# Patient Record
Sex: Female | Born: 1965
Health system: Southern US, Community
[De-identification: ages and names within clinical notes are randomized; demographics above are authoritative.]

## PROBLEM LIST (undated history)

## (undated) DIAGNOSIS — Z8659 Personal history of other mental and behavioral disorders: Secondary | ICD-10-CM

## (undated) DIAGNOSIS — C801 Malignant (primary) neoplasm, unspecified: Secondary | ICD-10-CM

## (undated) DIAGNOSIS — B009 Herpesviral infection, unspecified: Secondary | ICD-10-CM

## (undated) DIAGNOSIS — M1990 Unspecified osteoarthritis, unspecified site: Secondary | ICD-10-CM

## (undated) HISTORY — DX: Unspecified osteoarthritis, unspecified site: M19.90

## (undated) HISTORY — PX: HERNIA REPAIR: SHX51

## (undated) HISTORY — DX: Personal history of other mental and behavioral disorders: Z86.59

## (undated) HISTORY — DX: Malignant (primary) neoplasm, unspecified: C80.1

## (undated) HISTORY — PX: LIPOSUCTION: SHX10

## (undated) HISTORY — DX: Herpesviral infection, unspecified: B00.9

## (undated) HISTORY — PX: BASAL CELL CARCINOMA EXCISION: SHX1214

---

## 1998-10-14 ENCOUNTER — Encounter: Payer: Self-pay | Admitting: Endocrinology

## 1998-10-14 ENCOUNTER — Ambulatory Visit (HOSPITAL_COMMUNITY): Admission: RE | Admit: 1998-10-14 | Discharge: 1998-10-14 | Payer: Self-pay | Admitting: Endocrinology

## 1998-10-19 ENCOUNTER — Other Ambulatory Visit: Admission: RE | Admit: 1998-10-19 | Discharge: 1998-10-19 | Payer: Self-pay | Admitting: Obstetrics & Gynecology

## 1999-11-14 ENCOUNTER — Other Ambulatory Visit: Admission: RE | Admit: 1999-11-14 | Discharge: 1999-11-14 | Payer: Self-pay | Admitting: Obstetrics & Gynecology

## 2000-12-27 ENCOUNTER — Encounter (INDEPENDENT_AMBULATORY_CARE_PROVIDER_SITE_OTHER): Payer: Self-pay | Admitting: Specialist

## 2000-12-27 ENCOUNTER — Inpatient Hospital Stay (HOSPITAL_COMMUNITY): Admission: AD | Admit: 2000-12-27 | Discharge: 2000-12-31 | Payer: Self-pay | Admitting: Obstetrics & Gynecology

## 2001-02-03 ENCOUNTER — Other Ambulatory Visit: Admission: RE | Admit: 2001-02-03 | Discharge: 2001-02-03 | Payer: Self-pay | Admitting: Obstetrics and Gynecology

## 2002-02-23 ENCOUNTER — Other Ambulatory Visit: Admission: RE | Admit: 2002-02-23 | Discharge: 2002-02-23 | Payer: Self-pay | Admitting: Obstetrics and Gynecology

## 2002-12-09 ENCOUNTER — Encounter: Admission: RE | Admit: 2002-12-09 | Discharge: 2003-03-09 | Payer: Self-pay | Admitting: Obstetrics and Gynecology

## 2003-07-02 ENCOUNTER — Inpatient Hospital Stay (HOSPITAL_COMMUNITY): Admission: RE | Admit: 2003-07-02 | Discharge: 2003-07-05 | Payer: Self-pay | Admitting: Obstetrics and Gynecology

## 2003-08-13 ENCOUNTER — Other Ambulatory Visit: Admission: RE | Admit: 2003-08-13 | Discharge: 2003-08-13 | Payer: Self-pay | Admitting: Obstetrics and Gynecology

## 2005-06-29 ENCOUNTER — Other Ambulatory Visit: Admission: RE | Admit: 2005-06-29 | Discharge: 2005-06-29 | Payer: Self-pay | Admitting: Obstetrics and Gynecology

## 2006-05-01 ENCOUNTER — Encounter: Admission: RE | Admit: 2006-05-01 | Discharge: 2006-05-01 | Payer: Self-pay | Admitting: Obstetrics and Gynecology

## 2007-12-24 ENCOUNTER — Ambulatory Visit (HOSPITAL_BASED_OUTPATIENT_CLINIC_OR_DEPARTMENT_OTHER): Admission: RE | Admit: 2007-12-24 | Discharge: 2007-12-24 | Payer: Self-pay | Admitting: General Surgery

## 2009-02-24 ENCOUNTER — Encounter: Admission: RE | Admit: 2009-02-24 | Discharge: 2009-02-24 | Payer: Self-pay | Admitting: Family Medicine

## 2010-06-15 ENCOUNTER — Other Ambulatory Visit: Payer: Self-pay | Admitting: Dermatology

## 2010-07-20 ENCOUNTER — Other Ambulatory Visit: Payer: Self-pay | Admitting: Dermatology

## 2010-09-05 NOTE — Op Note (Signed)
Andrea Thomas, Andrea Thomas               ACCOUNT NO.:  [id number]   MEDICAL RECORD NO.:  [id number]          PATIENT TYPE:  AMB   LOCATION:  DSC                          FACILITY:  [facility]   PHYSICIAN:  Almond Lint, MD       DATE OF BIRTH:  [birth year]   DATE OF PROCEDURE:  12/24/2007  DATE OF DISCHARGE:                               OPERATIVE REPORT   PREOPERATIVE DIAGNOSIS:  Left infraclavicular mass.   POSTOPERATIVE DIAGNOSIS:  Left infraclavicular mass.   PROCEDURE PERFORMED:  Excision of left infraclavicular mass.   SURGEON:  Almond Lint, MD   ANESTHESIA:  MAC plus local.   FINDINGS:  Lobulated 5.3 cm mass which appears to be lipoma.   SPECIMEN:  Left infraclavicular mass to Pathology.   ESTIMATED BLOOD LOSS:  Minimal.   COMPLICATIONS:  None.   PROCEDURE:  Ms. Mervyn Gay was identified in the holding area, marked, and  taken to operating room where she was placed supine on the operating  room table.  Sedation was administered.  The left superior and lateral  chest was prepped and draped in a sterile fashion.  Time-out was  performed confirming the patient, site of surgery, operative staff,  preoperative antibiotic administration, and equipment.  This was correct  and we elected to proceed.  Local anesthesia was infiltrated over the  mass which was located very laterally on the clavicle.  A 3-cm incision  parallel to the clavicle was made overlying the mass after infiltrating  with local anesthesia, 1% lidocaine with epinephrine and 0.25% Marcaine  plain mixed was used.  The subcutaneous tissues were dissected to create  skin flaps with the Bovie electrocautery.  Once the mass was separated  from the subcutnaeous tissues in all sides, an Allis clamp was used to  elevate it from the wound bed.  The Bovie electrocautery was used to  come underneath the mass.  The mass was passed off the table and the  incision and surrounding areas were palpated carefully to ensure that no  residual mass remained.  It was then irrigated and hemostasis was  achieved with Bovie electrocautery.  The incision was then closed using  3-0 Vicryl in a interrupted deep dermal fashion and 4-0 Monocryl in a  running subcuticular fashion.  The wound was cleaned, dried, and dressed  with Dermabond and then the patient was awakened from anesthesia and  taken to PACU in stable condition.     Almond Lint, MD  Electronically Signed    FB/MEDQ  D:  12/24/2007  T:  12/25/2007  Job:  [id number]

## 2010-09-08 NOTE — Op Note (Signed)
[facility]  Patient:    Andrea Thomas, Andrea Thomas Visit Number: [id number] MRN: [id number]          Service Type: OBS Location: 910A 9139 01 Attending Physician:  Melony Overly Dictated by:   Devoria Albe Edward Jolly, M.D. Proc. Date: 12/28/00 Admit Date:  12/27/2000                             Operative Report  PREOPERATIVE DIAGNOSES:       1. Intrauterine pregnancy at 41+1 weeks.                               2. Failure to progress.                               3. Chorioamnionitis.  POSTOPERATIVE DIAGNOSES:      1. Intrauterine pregnancy at 41+1 weeks.                               2. Failure to progress.                               3. Chorioamnionitis.  PROCEDURE:                    Primary low segment transverse cesarean section.  SURGEON:                      Brook A. Edward Jolly, M.D.  ANESTHESIA:                   Epidural.  INTRAVENOUS FLUIDS:           1500 cc Ringers lactate.  ESTIMATED BLOOD LOSS:         1000 cc.  URINE OUTPUT:                 600 cc.  COMPLICATIONS:                None.  INDICATIONS:                  The patient was a 45 year old, gravida 1 para 0, admitted on December 27, 2000 at [redacted] weeks gestation, [facility] December 20, 2000, scheduled for induction on December 30, 2000 for post dates who was sent to the [facility] directly from the office for a spontaneous one minute deceleration noted during fetal movement on the routine nonstress test. Fetal heart rate was noted to rise spontaneously. Upon arrival to labor and delivery, the fetal heart rate tracing was noted to have a baseline of 120 with beat-to-beat variability and accelerations with reactivity. No decelerations of the fetal heart rate were noted. The patient had an occasional contraction. The cervix was examined there and noted to be closed, 50% effaced, and with the vertex at the -2 station.  The patient was admitted, and a recommendation was made to proceed with  a trial of vaginal delivery via Pitocin induction if the fetal heart rate remained reassuring. The patient and her husband chose to proceed as was scheduled. On December 28, 2000, the patients contractions became more painful and regular, and she received Stadol and then an epidural for anesthesia. An IUPC  was placed when the cervix achieved 4 cm of dilatation. The patient did have spontaneous rupture of membranes at 0038 with clear fluid. The patient went on to a cervical dilatation to the 6-7 cm. [place] units were adequate and the patient had no evidence of further cervical change beyond this point after two and one half hours. The patients temperature was also noted to rise to 100.3 degrees Fahrenheit. The fetal heart rate tracing continued to be reactive and reassuring. The patient was given a diagnosis of failure to progress with chorioamnionitis, and the recommendation was made to proceed with a primary low segment transverse cesarean section after the risks, benefits, and alternatives were discussed.  FINDINGS:                     A viable female infant was born at 2 with Apgars of 9 at one minute and 9 at five minutes. The weight was 9 pounds 2 ounces. The infant was noted to be vigorous at birth. No anomalies were appreciated. The placenta was also noted to have a normal appearance with insertion of a three-vessel cord. There was a 1.5-cm right paraovarian cyst. The left ovary and bilateral tubes were normal. There was an extension of the right inferior aspect of the lower uterine segment incision which extended down into the lower uterine segment and the vagina.  SPECIMENS:                    The placenta was sent to pathology.  DESCRIPTION OF PROCEDURE:     With an IV, Foley catheter, and epidural in place, the patient was taken from her labor and delivery suite to the operating room. The patient was placed in the supine position and her epidural was dosed for surgery.  The patients abdomen was then sterilely prepped and draped.  After adequate anesthesia was insured, a Pfannenstiel incision was created sharply with the scalpel. This was carried down to the fascia using the same. The fascia was then scored in the midline, and the incision was extended bilaterally in an upward fashion using the Mayo scissors. The rectus muscles were dissected off of the overlying fascia using sharp dissection with the Mayo scissors inferiorly and superiorly. The rectus muscles were then separated in the midline with a combination of blunt and sharp dissection with the Mayo scissors.  The peritoneal cavity was entered sharply with the Metzenbaum scissors. It was elevated with two hemostat clamps. The incision was extended cranially and caudally. A retractor then was placed over the lower uterine segment. The bladder flap was created sharply with the Metzenbaum scissors. The lower uterine segment was incised transversely with a scalpel. The incision was extended bilaterally with bandage scissors. A hand was inserted through the uterine incision, and the vertex was delivered. The nares and mouth were suctioned followed by delivery of the remainder of the infant. The cord was doubly clamped and cut, and the newborn was carried over the awaiting pediatricians. He was noted to be vigorous at birth.  Cord blood was obtained. The patient did receive Cefotetan 1 g IV. The placenta was manually extracted, and the patient received Pitocin 20 units intravenously. A moistened lap pad was used to remove any remaining membranes from within the uterine cavity. The extension of the lower uterine segment incision was then examined and its limits were defined. The extension was closed first with a running locked closure of #1 chromic. The transverse uterine incision was then also closed  with a running locked closure of #1 chromic. The transverse incision was closed with a second  imbricating layer of #1 chromic. An additional figure-of-eight suture was placed at the base, most inferior portion, of the lower uterine segment extension for good  hemostasis. There was extensive decidual reaction noted along the right anterior fundal region, and this was noted to be oozing. It responded to monopolar coagulation.  The uterus was returned to the peritoneal cavity, as it had been exteriorized for the procedure. The paracolic gutters were then irrigated with crystalloid solution and suctioned of the remaining fluid. The uterine incision was reexamined along with the extension, and hemostasis was noted to be excellent.  The edges of the fascia were examined, and there was no evidence of any hematoma. The fascia was therefore closed with a running suture of 0 Vicryl. The subcutaneous tissue was irrigated and suctioned with crystalloid solution. Hemostasis of small bleeding vessels was achieved with monopolar cautery. Interrupted sutures of 3-0 plain were placed in the subcutaneous layer followed by staples on the skin. A sterile bandage was then placed over this.  The uterus was expressed of any remaining clots. The patient was escorted to the recovery room in stable and awake condition. There were no complications to the procedure. All needle, instrument, and sponge counts were correct. ictated by:   Devoria Albe. Edward Jolly, M.D. Attending Physician:  Melony Overly DD:  12/28/00 TD:  12/30/00 Job: [id number]

## 2010-09-08 NOTE — Op Note (Signed)
NAMENELY, Andrea Thomas                         ACCOUNT NO.:  [id number]   MEDICAL RECORD NO.:  [id number]                   PATIENT TYPE:  INP   LOCATION:  NA                                   FACILITY:  [facility]   PHYSICIAN:  Carrington Clamp, M.D.              DATE OF BIRTH:  07-22-1965   DATE OF PROCEDURE:  07/02/2003  DATE OF DISCHARGE:                                 OPERATIVE REPORT   PREOPERATIVE DIAGNOSES:  1. Term pregnancy.  2. Prior cesarean section, desires repeat.   POSTOPERATIVE DIAGNOSES:  1. Term pregnancy.  2. Prior cesarean section, desires repeat.   PROCEDURE:  Repeat low transverse cesarean section at term.   ATTENDING:  Carrington Clamp, M.D.   ASSISTANT:  Luvenia Redden, M.D.   ANESTHESIA:  Spinal.   ESTIMATED BLOOD LOSS:  800 mL.   FLUIDS REPLACED:  3000 mL.   URINE OUTPUT:  150 mL.   COMPLICATIONS:  None.   FINDINGS:  A baby in the cephalic presentation; however, when the head was  attempted to be delivered, the head actually floated to a transverse  position.  Internal podalic version was successfully maneuvered to allow the  baby to be delivered breech in the usual fashion.  There were no  complications from this.  The baby was female, Apgars 9 and 9, weight 7  pounds 8 ounces.  There were normal tubes, ovaries, and uterus seen.   MEDICATIONS:  Clindamycin and Pitocin.   PATHOLOGY:  None.   COUNTS:  Correct x3.   TECHNIQUE:  After adequate spinal anesthesia was achieved, the patient was  prepped and draped in the usual sterile fashion in the dorsal supine  position with leftward tilt.  A Pfannenstiel skin incision was made with the  scalpel and carried down to the fascia with the Bovie cautery.  The fascia  was incised in the midline with the scalpel and then extended in a  transverse curvilinear manner with the Mayo scissors.  The fascia was  reflected superiorly and inferiorly from the rectus muscles and the rectus  muscles split in the  midline. The peritoneum was actually at this point  opened and the uterus could be seen.  Metzenbaums used to incise the  peritoneum in an inferior and superior manner with good visualization of the  bowel and bladder.   The bladder blade was placed and the vesicouterine fascia tented up and  incised in a transverse curvilinear manner.  The bladder flap was created  and the bladder blade replaced.  A 2 cm incision was made in the upper  portion of the lower uterine segment until the amnion was identified.  The  incision was extended in a transverse curvilinear manner and the amnion  ruptured with a pair of Allis clamps.  Clear fluid was noted.   The baby's head was grasped at this point with my right hand; however,  instead of coming  down toward the incision, the head actually floated up to  transverse position, where the baby was now back-up transverse.  At this  point it was decided to do an internal podalic version and the baby's left  leg was identified, palpated, and then brought through the incision.  The  right leg followed.  The hips were grasped and the baby was delivered from  the breech presentation without complication in the usual manner.  The baby  was bulb-suctioned and the cord was clamped and cut and the baby was handed  to awaiting pediatrics.   The cord bloods were obtained.  The placenta was delivered manually and the  uterus exteriorized, wrapped in a wet lap, cleared of all debris.  A locking  stitch of 0 Monocryl was performed, followed by an imbricating layer of 0  Monocryl.  Additional figure-of-eight stitches, approximately four of them,  were used to ensure hemostasis.   Once hemostasis was achieved, the uterus was reapproximated in the abdomen  and the gutters cleared of all debris with irrigation.  The uterine incision  was reinspected and found to be hemostatic.  The peritoneum was then closed  with a running stitch of 2-0 Vicryl.  The fascia was closed  with a running  stitch of 0 Vicryl.  The subcutaneous tissue was rendered hemostatic with  the Bovie cautery and irrigation.  This layer was then closed with  interrupted 2-0 plain gut stitches.  The skin was closed with staples.  The  patient tolerated the procedure well, was returned to the recovery room in  stable condition.                                               Carrington Clamp, M.D.    MH/MEDQ  D:  07/02/2003  T:  07/03/2003  Job:  [id number]

## 2010-09-08 NOTE — Discharge Summary (Signed)
[facility]  Patient:    Andrea Thomas, Andrea Thomas Visit Number: [id number] MRN: [id number]          Service Type: OBS Location: 910A 9139 01 Attending Physician:  Melony Overly Proc. Date: 12/27/00 Admit Date:  12/27/2000 Discharge Date: 12/31/2000                             Discharge Summary  ADMISSION DIAGNOSES:          Intrauterine pregnancy at 41 weeks.  DISCHARGE DIAGNOSES:          1. Intrauterine pregnancy at 41 weeks.                               2. Status post primary low transverse cesarean                                  section.                               3. Chorioamnionitis.  HOSPITAL COURSE:              The patient is a 45 year old, gravida 1, para 0, at 41 weeks who was admitted to labor and delivery for induction after spontaneous decelleration noted while having a routine NST in the office.  The patient was admitted.  Her induction was started, however, she failed to progress.  She did develop a temperature during labor and she was given antibiotics.  A primary low transverse cesarean section was performed and the baby was a viable female with Apgars of 9 and 9.  The patient did well postoperatively.  She defervesced fine.  She had an uncomplicated postoperative course.  Her postoperative day #1 hemoglobin was 8.3 and postoperative white blood cell count was 12.8.  She was discharged home on postoperative day #3 in good condition.  She was discharged home with instructions to call if she has any nausea, vomiting, severe abdominal pain, redness from incision site.  She was given prescriptions for Motrin and Tylox.   FOLLOW-UP:                    She will follow up in the office in four to six weeks.  DISCHARGE INSTRUCTIONS:       She was advised no driving for two to three weeks. Attending Physician:  Melony Overly DD:  01/22/01 TD:  01/22/01 Job: [id number]

## 2010-09-08 NOTE — Discharge Summary (Signed)
Andrea Thomas, Andrea Thomas                         ACCOUNT NO.:  [id number]   MEDICAL RECORD NO.:  [id number]                   PATIENT TYPE:  INP   LOCATION:  9122                                 FACILITY:  [facility]   PHYSICIAN:  Carrington Clamp, M.D.              DATE OF BIRTH:  11/12/1965   DATE OF ADMISSION:  07/02/2003  DATE OF DISCHARGE:  07/05/2003                                 DISCHARGE SUMMARY   DIAGNOSES:  1. Intrauterine pregnancy at term.  2. History of prior cesarean section and desires repeat cesarean section.  3. Declines vaginal birth after cesarean.   PROCEDURE:  Repeat low transverse cesarean section.  Surgeon:  Dr. Carrington Clamp.  Assistant:  Dr. Lodema Hong.  Complications:  None.   This 45 year old G2 P1-0-0-1 presents at [redacted] weeks gestation for repeat  cesarean section.  The patient's antepartum course had been complicated by  advanced maternal age.  She declined amniocentesis or any further testing.  The patient also had a history of depression but did not need any  medications during this current pregnancy.  Otherwise, the patient's  antepartum course had been uncomplicated.  She had a negative group B strep  culture performed in the office.  She was admitted at this time for repeat  cesarean section.  She was taken to the operating room on July 02, 2003 by  Dr. Carrington Clamp where repeat low transverse cesarean section was  performed with the delivery of a 7-pound 8-ounce female infant with Apgars  of 9 and 9.  Delivery went without complications.  The patient's  postoperative course was benign without significant fevers.  She was felt  ready for discharge on postoperative day #3.  She was sent home on a regular  diet, told to decrease activities, told to continue prenatal vitamins, was  given a prescription for Percocet one q.4-6h. as needed for pain, was to  follow up in the office in 2 weeks for an incision check; of course, was to  call with any  increased pain or fevers.   LABORATORY DATA ON DISCHARGE:  The patient had a hemoglobin of 8.6; white  blood cell count of 7.6; and platelets of 201,000.     [name], P.A.-C.                Carrington Clamp, M.D.    MB/MEDQ  D:  08/02/2003  T:  08/02/2003  Job:  [id number]

## 2011-08-29 ENCOUNTER — Other Ambulatory Visit: Payer: Self-pay | Admitting: Dermatology

## 2011-10-10 ENCOUNTER — Other Ambulatory Visit: Payer: Self-pay | Admitting: Obstetrics and Gynecology

## 2011-10-10 DIAGNOSIS — N63 Unspecified lump in unspecified breast: Secondary | ICD-10-CM

## 2011-10-16 ENCOUNTER — Ambulatory Visit
Admission: RE | Admit: 2011-10-16 | Discharge: 2011-10-16 | Disposition: A | Discharge status: Home / Self Care | Payer: BC Managed Care – PPO | Source: Ambulatory Visit | Attending: Obstetrics and Gynecology | Admitting: Obstetrics and Gynecology

## 2011-10-16 DIAGNOSIS — N63 Unspecified lump in unspecified breast: Secondary | ICD-10-CM

## 2011-10-26 ENCOUNTER — Other Ambulatory Visit: Payer: Self-pay | Admitting: Dermatology

## 2012-10-13 ENCOUNTER — Other Ambulatory Visit: Payer: Self-pay | Admitting: Obstetrics & Gynecology

## 2013-10-09 ENCOUNTER — Other Ambulatory Visit: Payer: Self-pay | Admitting: Dermatology

## 2014-10-29 ENCOUNTER — Other Ambulatory Visit: Payer: Self-pay | Admitting: Orthopaedic Surgery

## 2014-10-29 DIAGNOSIS — M25572 Pain in left ankle and joints of left foot: Secondary | ICD-10-CM

## 2014-11-11 ENCOUNTER — Ambulatory Visit
Admission: RE | Admit: 2014-11-11 | Discharge: 2014-11-11 | Disposition: A | Discharge status: Home / Self Care | Payer: Self-pay | Source: Ambulatory Visit | Attending: Orthopaedic Surgery | Admitting: Orthopaedic Surgery

## 2014-11-11 DIAGNOSIS — M25572 Pain in left ankle and joints of left foot: Secondary | ICD-10-CM

## 2014-11-17 ENCOUNTER — Ambulatory Visit (INDEPENDENT_AMBULATORY_CARE_PROVIDER_SITE_OTHER): Payer: BLUE CROSS/BLUE SHIELD | Admitting: Physician Assistant

## 2014-11-17 ENCOUNTER — Ambulatory Visit
Admission: RE | Admit: 2014-11-17 | Discharge: 2014-11-17 | Disposition: A | Discharge status: Home / Self Care | Payer: BLUE CROSS/BLUE SHIELD | Source: Ambulatory Visit | Attending: Physician Assistant | Admitting: Physician Assistant

## 2014-11-17 VITALS — BP 140/80 | HR 84 | Temp 98.3°F | Resp 16 | Ht 63.0 in | Wt 178.0 lb

## 2014-11-17 DIAGNOSIS — R1031 Right lower quadrant pain: Secondary | ICD-10-CM

## 2014-11-17 NOTE — Patient Instructions (Signed)
Go to [facility] [address] At 3:10 pm for 3:30 appt. Ph# [phone]

## 2014-11-18 ENCOUNTER — Other Ambulatory Visit: Payer: Self-pay | Admitting: Physician Assistant

## 2014-11-18 DIAGNOSIS — M25551 Pain in right hip: Secondary | ICD-10-CM

## 2014-11-18 NOTE — Progress Notes (Signed)
   11/18/2014 at 6:24 AM  Raymond / DOB: [birth year] / MRN: [id number]  The patient  does not have a problem list on file.  SUBJECTIVE  Chief complaint: Pain in rt inguinal area  Patient here today for right inguinal pain that worsened after receiving a steroid shot roughly 7 days ago for same. She associates some mild swelling and tenderness of the area.  She has never had this problem before.    She  has a past medical history of Arthritis and Cancer (Basil Cell).    Medications reviewed and updated by myself where necessary, and exist elsewhere in the encounter.   Ms. Merwyn Katos is allergic to penicillins. She  reports that she has never smoked. She does not have any smokeless tobacco history on file. She reports that she drinks about 0.6 oz of alcohol per week. She reports that she does not use illicit drugs. She  has no sexual activity history on file. The patient  has past surgical history that includes Cesarean section and Hernia repair.  Her family history includes Cancer in her father; Heart disease in her father.  Review of Systems  Constitutional: Negative for fever.  Gastrointestinal: Negative for nausea.  Musculoskeletal: Positive for myalgias. Negative for joint pain.  Skin: Negative for rash.  Neurological: Negative for dizziness.    OBJECTIVE  Her  height is 5\' 3"  (1.6 m) and weight is 178 lb (80.74 kg). Her oral temperature is 98.3 F (36.8 C). Her blood pressure is 140/80 and her pulse is 84. Her respiration is 16 and oxygen saturation is 99%.  The patient's body mass index is 31.54 kg/(m^2).  Physical Exam  Vitals reviewed. Constitutional: She is oriented to person, place, and time.  Cardiovascular: Normal rate.   Respiratory: Effort normal.  Musculoskeletal:       Legs: Lymphadenopathy:       Right: No inguinal adenopathy present.       Left: No inguinal adenopathy present.  Neurological: She is alert and oriented to person, place, and time.  Skin:  Skin is warm and dry.     No results found for this or any previous visit (from the past 24 hour(s)).  CLINICAL DATA: Right inguinal pain for 1 month.  EXAM: LIMITED ULTRASOUND OF PELVIS  TECHNIQUE: Limited transabdominal ultrasound examination of the pelvis was performed.  COMPARISON: None.  FINDINGS: Limited sonographic evaluation of the right inguinal region demonstrates no evidence of mass or fluid collection. No definite hernia is noted.  IMPRESSION: No definite sonographic abnormality seen in the right inguinal region.  Electronically Signed  By: Marijo Conception, M.D.  On: 11/17/2014 15:58  ASSESSMENT & PLAN  Nicolle was seen today for pain in rt inguinal area.  Diagnoses and all orders for this visit:  Inguinal pain, right: Exam and [place] negative and reassuring.  Her pain is most likely MSK in origin and now worsened despite steroid injection. Delivered results to patient and advised we treat with NSAID therapy, which she declined.   Orders: -     US Pelvis Limited; Future    The patient was advised to call or come back to clinic if she does not see an improvement in symptoms, or worsens with the above plan.   Philis Fendt, MHS, PA-C [facility] 11/18/2014 6:24 AM

## 2014-12-07 NOTE — Progress Notes (Signed)
  Medical screening examination/treatment/procedure(s) were performed by non-physician practitioner and as supervising physician I was immediately available for consultation/collaboration.     

## 2014-12-28 ENCOUNTER — Ambulatory Visit
Admission: RE | Admit: 2014-12-28 | Discharge: 2014-12-28 | Disposition: A | Discharge status: Home / Self Care | Payer: BLUE CROSS/BLUE SHIELD | Source: Ambulatory Visit | Attending: Physician Assistant | Admitting: Physician Assistant

## 2014-12-28 DIAGNOSIS — M25551 Pain in right hip: Secondary | ICD-10-CM

## 2014-12-28 MED ORDER — IOHEXOL 180 MG/ML  SOLN
11.0000 mL | Freq: Once | INTRAMUSCULAR | Status: DC | PRN
  Administered 2014-12-28: 11 mL via INTRA_ARTICULAR

## 2016-01-05 ENCOUNTER — Other Ambulatory Visit: Payer: Self-pay | Admitting: Obstetrics & Gynecology

## 2016-01-05 DIAGNOSIS — R221 Localized swelling, mass and lump, neck: Secondary | ICD-10-CM

## 2016-01-09 ENCOUNTER — Other Ambulatory Visit: Payer: Self-pay | Admitting: Obstetrics & Gynecology

## 2016-01-09 DIAGNOSIS — R928 Other abnormal and inconclusive findings on diagnostic imaging of breast: Secondary | ICD-10-CM

## 2016-01-13 ENCOUNTER — Ambulatory Visit
Admission: RE | Admit: 2016-01-13 | Discharge: 2016-01-13 | Disposition: A | Discharge status: Home / Self Care | Payer: BLUE CROSS/BLUE SHIELD | Source: Ambulatory Visit | Attending: Obstetrics & Gynecology | Admitting: Obstetrics & Gynecology

## 2016-01-13 DIAGNOSIS — R928 Other abnormal and inconclusive findings on diagnostic imaging of breast: Secondary | ICD-10-CM

## 2016-01-13 DIAGNOSIS — R221 Localized swelling, mass and lump, neck: Secondary | ICD-10-CM

## 2016-07-03 ENCOUNTER — Other Ambulatory Visit: Payer: Self-pay | Admitting: Obstetrics & Gynecology

## 2016-07-03 DIAGNOSIS — N6489 Other specified disorders of breast: Secondary | ICD-10-CM

## 2016-07-31 ENCOUNTER — Ambulatory Visit
Admission: RE | Admit: 2016-07-31 | Discharge: 2016-07-31 | Disposition: A | Discharge status: Home / Self Care | Payer: BLUE CROSS/BLUE SHIELD | Source: Ambulatory Visit | Attending: Obstetrics & Gynecology | Admitting: Obstetrics & Gynecology

## 2016-07-31 DIAGNOSIS — N6489 Other specified disorders of breast: Secondary | ICD-10-CM

## 2016-08-01 ENCOUNTER — Ambulatory Visit (INDEPENDENT_AMBULATORY_CARE_PROVIDER_SITE_OTHER): Payer: BLUE CROSS/BLUE SHIELD

## 2016-08-01 ENCOUNTER — Ambulatory Visit: Payer: Self-pay

## 2016-08-01 ENCOUNTER — Encounter: Payer: Self-pay | Admitting: Sports Medicine

## 2016-08-01 ENCOUNTER — Ambulatory Visit (INDEPENDENT_AMBULATORY_CARE_PROVIDER_SITE_OTHER): Payer: BLUE CROSS/BLUE SHIELD | Admitting: Sports Medicine

## 2016-08-01 VITALS — BP 120/86 | HR 73 | Ht 62.5 in | Wt 182.4 lb

## 2016-08-01 DIAGNOSIS — M25551 Pain in right hip: Secondary | ICD-10-CM

## 2016-08-01 NOTE — Progress Notes (Signed)
OFFICE VISIT NOTE Andrea Thomas. Andrea Thomas, [facility] - 51 y.o. female MRN [id number]  Date of birth: 1965-10-09  Visit Date: 08/01/2016  PCP: No primary care provider on file.   Referred by: No ref. provider found  SUBJECTIVE:   Chief Complaint  Patient presents with  . pain in gluteal muscle/groin    right side.Played in tennis match Monday night 07/30/16-felt pull then. Played through pain for rest of match. Pain at hip flexor and high HS + adductors/groin. Pain level described as a 5.5/10. Very painful from sitting to standing. Pain with driving with R leg fully extended. previosuly injured same area 1.5 months ago. Has taken Aleve for pain; no other method of tx by patient.   HPI: As above. Additional pertinent information includes:  Has previously undergone MRI of the right hip that was negative for any acute findings.   ROS: Review of Systems  Constitutional: Negative for chills, fever and weight loss.  Cardiovascular: Positive for leg swelling.  Skin: Negative for rash.  Neurological: Negative for sensory change and focal weakness.    Otherwise per HPI.  HISTORY & PERTINENT PRIOR DATA:  No specialty comments available. She reports that she has never smoked. She has never used smokeless tobacco. No results for input(s): HGBA1C, LABURIC in the last 8760 hours. Medications & Allergies reviewed per EMR Patient Active Problem List   Diagnosis Date Noted  . Right hip pain 08/01/2016   Past Medical History:  Diagnosis Date  . Arthritis   . Cancer [facility]) [facility] Cell   Family History  Problem Relation Age of Onset  . Cancer Father        lymphoma  . Heart disease Father    Past Surgical History:  Procedure Laterality Date  . CESAREAN SECTION    . HERNIA REPAIR     51 years old   Social History   Occupational History  . Not on file.   Social History Main Topics  . Smoking status: Never  Smoker  . Smokeless tobacco: Never Used  . Alcohol use 0.6 oz/week    1 Standard drinks or equivalent per week  . Drug use: No  . Sexual activity: Not on file    OBJECTIVE:  VS:  HT:5' 2.5" (158.8 cm)   WT:182 lb 6.4 oz (82.7 kg)  BMI:32.9    BP:120/86  HR:73bpm  TEMP: ( )  RESP:98 % Physical Exam  IMAGING & PROCEDURES: No results found. Findings:  WDWN, NAD, Non-toxic appearing Alert & appropriately interactive Not depressed or anxious appearing No increased work of breathing. Pupils are equal. EOM intact without nystagmus No clubbing or cyanosis of the extremities appreciated No significant rashes/lesions/ulcerations overlying the examined area. DP & PT pulses 2+/4.  No significant pretibial edema.  No clubbing or cyanosis Sensation intact to light touch in lower extremities.  Hip exam: Pain with logroll as well as FADIR.  Pain with Stinchfield. Lower extremity strength is otherwise intact.  No significant back pain to palpation.  Her ASIS are equal bilaterally.    ASSESSMENT & PLAN:   Problem List Items Addressed This Visit    Right hip pain - Primary    Injection provided today.  I am concerned she has an occult labral tear that was not seen on prior MRI.  Ultrasound did show some abnormality within the anterior labrum.  Therapeutic and diagnostic injection performed today.  If any persistent ongoing  symptoms and lack of improvement with PT would consider repeat MR arthrogram for possible false-negative previously.  Referral to physical therapy placed.   PROCEDURE NOTE -  ULTRASOUND GUIDEDINJECTION: Right Hip IA Images were obtained and interpreted by myself, Teresa Coombs, DO  Images have been saved and stored to PACS system. Images obtained on: GE S7 Ultrasound machine  DESCRIPTION OF PROCEDURE:  The patient's clinical condition is marked by substantial pain and/or significant functional disability. Other conservative therapy has not provided relief, is  contraindicated, or not appropriate. There is a reasonable likelihood that injection will significantly improve the patient's pain and/or functional impairment. After discussing the risks, benefits and expected outcomes of the injection and all questions were reviewed and answered, the patient wished to undergo the above named procedure. Verbal consent was obtained. The ultrasound was used to identify the target structure and adjacent neurovascular structures. The skin was then prepped in sterile fashion and the target structure was injected under direct visualization using sterile technique as below: PREP: Alcohol, Ethel Chloride APPROACH: Anterior, ,StopCock Technique, 22g 3.5" needle INJECTATE: 5cc 1% lidocaine, 2cc 0.5% marcaine, 2cc 40mg  DepoMedrol ASPIRATE: N/A DRESSING: Band-Aid  Post procedural instructions including recommending icing and warning signs for infection were reviewed. This procedure was well tolerated and there were no complications.   IMPRESSION: Succesful US Guided Injection          Relevant Orders   DG HIP UNILAT W OR W/O PELVIS 2-3 VIEWS RIGHT (Completed)   Ambulatory referral to Physical Therapy   US GUIDED NEEDLE PLACEMENT(NO LINKED CHARGES)      Follow-up: Return in about 6 weeks (around 09/12/2016).   Otherwise please see problem oriented charting as below.

## 2016-08-01 NOTE — Assessment & Plan Note (Addendum)
Injection provided today.  I am concerned she has an occult labral tear that was not seen on prior MRI.  Ultrasound did show some abnormality within the anterior labrum.  Therapeutic and diagnostic injection performed today.  If any persistent ongoing symptoms and lack of improvement with PT would consider repeat MR arthrogram for possible false-negative previously.  Referral to physical therapy placed.   PROCEDURE NOTE -  ULTRASOUND GUIDEDINJECTION: Right Hip IA Images were obtained and interpreted by myself, Teresa Coombs, DO  Images have been saved and stored to PACS system. Images obtained on: GE S7 Ultrasound machine  DESCRIPTION OF PROCEDURE:  The patient's clinical condition is marked by substantial pain and/or significant functional disability. Other conservative therapy has not provided relief, is contraindicated, or not appropriate. There is a reasonable likelihood that injection will significantly improve the patient's pain and/or functional impairment. After discussing the risks, benefits and expected outcomes of the injection and all questions were reviewed and answered, the patient wished to undergo the above named procedure. Verbal consent was obtained. The ultrasound was used to identify the target structure and adjacent neurovascular structures. The skin was then prepped in sterile fashion and the target structure was injected under direct visualization using sterile technique as below: PREP: Alcohol, Ethel Chloride APPROACH: Anterior, ,StopCock Technique, 22g 3.5" needle INJECTATE: 5cc 1% lidocaine, 2cc 0.5% marcaine, 2cc 40mg  DepoMedrol ASPIRATE: N/A DRESSING: Band-Aid  Post procedural instructions including recommending icing and warning signs for infection were reviewed. This procedure was well tolerated and there were no complications.   IMPRESSION: Succesful US Guided Injection

## 2016-08-09 ENCOUNTER — Ambulatory Visit (INDEPENDENT_AMBULATORY_CARE_PROVIDER_SITE_OTHER): Payer: BLUE CROSS/BLUE SHIELD | Admitting: Physical Therapy

## 2016-08-09 DIAGNOSIS — M25551 Pain in right hip: Secondary | ICD-10-CM | POA: Diagnosis not present

## 2016-08-09 DIAGNOSIS — M6281 Muscle weakness (generalized): Secondary | ICD-10-CM | POA: Diagnosis not present

## 2016-08-09 DIAGNOSIS — R29898 Other symptoms and signs involving the musculoskeletal system: Secondary | ICD-10-CM

## 2016-08-09 NOTE — Therapy (Signed)
[facility] [address] [place], [place], [address] Phone: [phone]   Fax:  [phone]  Physical Therapy Evaluation  Patient Details  Name: Andrea Thomas MRN: [id number] Date of Birth: April 27, 1965 Referring Provider: Dr. Teresa Coombs  Encounter Date: 08/09/2016      PT End of Session - 08/09/16 1206    Visit Number 1   Number of Visits 12   Date for PT Re-Evaluation 09/20/16   Authorization Type BCBS - precert required after 19 visits   PT Start Time 1100   PT Stop Time 1145   PT Time Calculation (min) 45 min   Activity Tolerance Patient tolerated treatment well   Behavior During Therapy [facility] for tasks assessed/performed      Past Medical History:  Diagnosis Date  . Arthritis   . Cancer [facility]) [place]    Past Surgical History:  Procedure Laterality Date  . CESAREAN SECTION    . HERNIA REPAIR     51 years old    There were no vitals filed for this visit.       Subjective Assessment - 08/09/16 1103    Subjective Pt is a 51 y/o female who presents to OPPT with 2 yr history of Rt hip pain.  Pt active with tennis and pure barre.  Pt say ortho MD and dx with bursitis and tx with injection.  Pt reports approx 1.5 months ago felt a pull in glut then traveled without difficulty.  Upon returning back to tennis pain returned.  [place] injection revealed possible labral tear.  Pt presents 1.5 wks post injection with improved pain but not resolved.    Pertinent History hx of multiple ortho injuries including: Rt ATFL tear, Lt hamstring tear   Limitations Standing;Walking   How long can you stand comfortably? after working out   How long can you walk comfortably? after working out   Patient Stated Goals improve pain, play tennis after pain   Currently in Pain? Yes   Pain Score 4   up to 7/10; at best 0/10   Pain Location Hip   Pain Orientation Right   Pain Descriptors / Indicators Throbbing  "giving out"   Pain Type Chronic  pain;Acute pain   Pain Onset More than a month ago   Pain Frequency Intermittent   Aggravating Factors  tennis, pure barre   Pain Relieving Factors aleve            [facility] PT Assessment - 08/09/16 1110      Assessment   Medical Diagnosis Rt hip pain   Referring Provider Dr. Teresa Coombs   Onset Date/Surgical Date --  1.5 months   Next MD Visit 09/14/16   Prior Therapy none     Precautions   Precautions None     Restrictions   Weight Bearing Restrictions No     Balance Screen   Has the patient fallen in the past 6 months No   Has the patient had a decrease in activity level because of a fear of falling?  No   Is the patient reluctant to leave their home because of a fear of falling?  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; desk work and up walking   Leisure tennis, pure barre, walking, reading     AROM   Overall AROM Comments bil hip ROM WNL     Strength   Strength Assessment Site Hip;Knee   Right/Left Hip Right;Left   Right Hip Flexion 4/5   Right Hip Extension 3-/5  difficult to lift against gravity, with pain   Right Hip External Rotation  3/5   Right Hip Internal Rotation 3+/5  with pain   Right Hip ABduction 4-/5  with pain   Right Hip ADduction 4/5   Left Hip Flexion 5/5   Left Hip Extension 4/5  difficult to lift against gravity; within available range   Left Hip External Rotation 5/5   Left Hip Internal Rotation 5/5   Left Hip ABduction 5/5   Left Hip ADduction 5/5   Right/Left Knee Right;Left   Right Knee Flexion 4/5   Right Knee Extension 5/5   Left Knee Flexion 4/5   Left Knee Extension 5/5     Flexibility   Soft Tissue  Assessment /Muscle Length yes  Rt hip flexor tightness   Hamstrings tightness bil     Palpation   Palpation comment Rt hip: tenderness at ASIS and greater trochanter     Ambulation/Gait   Gait Comments independent with amb without significant deviations noted                   [facility] Adult PT Treatment/Exercise - 08/09/16 1133      Exercises   Exercises Knee/Hip     Knee/Hip Exercises: Stretches   Passive Hamstring Stretch Right;1 rep;20 seconds   Passive Hamstring Stretch Limitations for HEP instruction     Knee/Hip Exercises: Standing   Hip Abduction Both;5 reps;Knee straight   Abduction Limitations with red theraband and slight hip extension; for HEP instruction     Knee/Hip Exercises: Supine   Bridges Both;10 reps   Bridges Limitations with red theraband                PT Education - 08/09/16 1156    Education provided Yes   Education Details HEP, POC, goals of care   Person(s) Educated Patient   Methods Explanation   Comprehension Verbalized understanding             PT Long Term Goals - 08/09/16 1212      PT LONG TERM GOAL #1   Title independent with HEP (09/20/16)   Time 6   Period Weeks   Status New     PT LONG TERM GOAL #2   Title report ability to play tennis at 75-100% intensity without increase in pain for improved function (09/20/16)   Time 6   Period Weeks   Status New     PT LONG TERM GOAL #3   Title demonstrate at least 4+/5 strength in Rt hip for improved function and mobility (09/20/16)   Time 6   Period Weeks   Status New               Plan - 08/09/16 1208    Clinical Impression Statement Pt is a 51 y/o female who presents to OPPT for low complexity PT eval for Rt hip pain.  Pt demonstrates decreased flexibility and strength affecting ability to return to recreational activities.  Pt will benefit from PT to address deficits listed.   Rehab Potential Good   PT Frequency 2x / week  PT Duration 6 weeks   PT  Treatment/Interventions ADLs/Self Care Home Management;Cryotherapy;Electrical Stimulation;Iontophoresis 4mg /ml Dexamethasone;Moist Heat;Ultrasound;Balance training;Therapeutic exercise;Therapeutic activities;Functional mobility training;Patient/family education;Manual techniques;Taping;Dry needling   PT Next Visit Plan review HEP, try hip flexor stretch, add ir/er strengthening to HEP, manual/ball work and eccentric hip strengthening   Consulted and Agree with Plan of Care Patient      Patient will benefit from skilled therapeutic intervention in order to improve the following deficits and impairments:  Pain, Increased fascial restricitons, Decreased strength, Impaired flexibility, Decreased mobility  Visit Diagnosis: Pain in right hip - Plan: PT plan of care cert/re-cert  Muscle weakness (generalized) - Plan: PT plan of care cert/re-cert  Other symptoms and signs involving the musculoskeletal system - Plan: PT plan of care cert/re-cert     Problem List Patient Active Problem List   Diagnosis Date Noted  . Right hip pain 08/01/2016      Laureen Abrahams, PT, DPT 08/09/16 12:17 PM    [place], [place], [address] Phone: [phone]   Fax:  [phone]  Name: KAWANNA CHRISTLEY MRN: [id number] Date of Birth: [birth year]

## 2016-08-09 NOTE — Patient Instructions (Signed)
   1. With band around thighs, perform bridge and hold resistance against the band.  Hold for 5 seconds.  Perform 10 reps.  Do 2 sessions per day.   2. With band looped around feet, step right foot slightly back and kick leg out to the side.  LEAD WITH HEEL!  Perform 10 reps on each leg.  Do 2 sessions per day.  3. Lie on your back with dog leash around left foot.  Keep knee straight and lift leg until gentle stretch is felt.  Hold 20-30 sec.  Perform 2-3 reps.  Perform 2 sessions per day.

## 2016-08-13 ENCOUNTER — Ambulatory Visit (INDEPENDENT_AMBULATORY_CARE_PROVIDER_SITE_OTHER): Payer: BLUE CROSS/BLUE SHIELD | Admitting: Physical Therapy

## 2016-08-13 DIAGNOSIS — R29898 Other symptoms and signs involving the musculoskeletal system: Secondary | ICD-10-CM | POA: Diagnosis not present

## 2016-08-13 DIAGNOSIS — M6281 Muscle weakness (generalized): Secondary | ICD-10-CM | POA: Diagnosis not present

## 2016-08-13 DIAGNOSIS — M25551 Pain in right hip: Secondary | ICD-10-CM

## 2016-08-13 NOTE — Therapy (Signed)
[facility] [address] [place], [place], [address] Phone: [phone]   Fax:  [phone]  Physical Therapy Treatment  Patient Details  Name: Andrea Thomas MRN: [id number] Date of Birth: Feb 12, 1966 Referring Provider: Dr. Teresa Coombs  Encounter Date: 08/13/2016      PT End of Session - 08/13/16 1148    Visit Number 2   Number of Visits 12   Date for PT Re-Evaluation 09/20/16   Authorization Type BCBS - precert required after 19 visits   PT Start Time 1101   PT Stop Time 1143   PT Time Calculation (min) 42 min   Activity Tolerance Patient tolerated treatment well   Behavior During Therapy [facility] for tasks assessed/performed      Past Medical History:  Diagnosis Date  . Arthritis   . Cancer [facility]) [place]    Past Surgical History:  Procedure Laterality Date  . CESAREAN SECTION    . HERNIA REPAIR     51 years old    There were no vitals filed for this visit.      Subjective Assessment - 08/13/16 1106    Subjective Rt hip feels much better, still having some pain in Rt buttock, especially with extension.   Pertinent History hx of multiple ortho injuries including: Rt ATFL tear, Lt hamstring tear   Patient Stated Goals improve pain, play tennis after pain   Currently in Pain? Yes   Pain Score 0-No pain   Pain Location Buttocks   Pain Orientation Right   Pain Descriptors / Indicators Discomfort   Pain Type Chronic pain;Acute pain                         [facility] Adult PT Treatment/Exercise - 08/13/16 1107      Knee/Hip Exercises: Aerobic   Stationary Bike L3 x 6 min     Knee/Hip Exercises: Standing   Hip Abduction Both;10 reps;Knee straight   Abduction Limitations with red theraband and slight hip extension   Hip Extension Both;10 reps;Knee straight   Extension Limitations with red theraband   Other Standing Knee Exercises monster walk forward/backwards/laterally 30' x2 each direction     Knee/Hip Exercises: Seated   Other Seated Knee/Hip Exercises Rt hip ir/er x 20 with red theraband     Knee/Hip Exercises: Supine   Bridges Both;10 reps   Bridges Limitations with red theraband   Other Supine Knee/Hip Exercises on orange physioball: bridges x 10; bridge with hamstring curl x 10     Knee/Hip Exercises: Sidelying   Hip ABduction Right;10 reps   Hip ABduction Limitations 2#, x10 with end range pulse x 20     Knee/Hip Exercises: Prone   Hip Extension Right;10 reps   Hip Extension Limitations with 90 deg knee flexion; 2#   Straight Leg Raises Right;10 reps   Straight Leg Raises Limitations 2#; x10 with end range pulse x 20                PT Education - 08/13/16 1148    Education provided Yes   Education Details add hip extension and monster walks to [employer]) Educated Patient   Methods Explanation;Demonstration   Comprehension Verbalized understanding             PT Long Term Goals - 08/13/16 1148      PT LONG TERM GOAL #1   Title independent with HEP (09/20/16)   Status On-going  PT LONG TERM GOAL #2   Title report ability to play tennis at 75-100% intensity without increase in pain for improved function (09/20/16)   Status On-going     PT LONG TERM GOAL #3   Title demonstrate at least 4+/5 strength in Rt hip for improved function and mobility (09/20/16)   Status On-going               Plan - 08/13/16 1148    Clinical Impression Statement Pt tolerated all exercises well without c/o pain.  Pt did have some discomfort and muscle fatigue with exercises, but no increase in pain.  Will continue to benefit from PT to maximize function and decrease pain.   PT Treatment/Interventions ADLs/Self Care Home Management;Cryotherapy;Electrical Stimulation;Iontophoresis 4mg /ml Dexamethasone;Moist Heat;Ultrasound;Balance training;Therapeutic exercise;Therapeutic activities;Functional mobility training;Patient/family education;Manual  techniques;Taping;Dry needling   PT Next Visit Plan continue hip strengthening (eccentric), add ir/er strengthening to HEP, manual/ball work   Oncologist with Plan of Care Patient      Patient will benefit from skilled therapeutic intervention in order to improve the following deficits and impairments:  Pain, Increased fascial restricitons, Decreased strength, Impaired flexibility, Decreased mobility  Visit Diagnosis: Pain in right hip  Muscle weakness (generalized)  Other symptoms and signs involving the musculoskeletal system     Problem List Patient Active Problem List   Diagnosis Date Noted  . Right hip pain 08/01/2016      Laureen Abrahams, PT, DPT 08/13/16 11:51 AM    [place], [place], [address] Phone: [phone]   Fax:  [phone]  Name: MARESHA ANASTOS MRN: [id number] Date of Birth: February 16, 1966

## 2016-08-20 ENCOUNTER — Ambulatory Visit (INDEPENDENT_AMBULATORY_CARE_PROVIDER_SITE_OTHER): Payer: BLUE CROSS/BLUE SHIELD | Admitting: Physical Therapy

## 2016-08-20 DIAGNOSIS — R29898 Other symptoms and signs involving the musculoskeletal system: Secondary | ICD-10-CM

## 2016-08-20 DIAGNOSIS — M6281 Muscle weakness (generalized): Secondary | ICD-10-CM

## 2016-08-20 DIAGNOSIS — M25551 Pain in right hip: Secondary | ICD-10-CM | POA: Diagnosis not present

## 2016-08-20 NOTE — Therapy (Signed)
[facility] [address] [place], [place], [address] Phone: [phone]   Fax:  [phone]  Physical Therapy Treatment  Patient Details  Name: Andrea Thomas MRN: [id number] Date of Birth: 10-24-1965 Referring Provider: Dr. Teresa Coombs  Encounter Date: 08/20/2016      PT End of Session - 08/20/16 1159    Visit Number 3   Number of Visits 12   Date for PT Re-Evaluation 09/20/16   Authorization Type BCBS - precert required after 19 visits   PT Start Time 1102   PT Stop Time 1145   PT Time Calculation (min) 43 min   Activity Tolerance Patient tolerated treatment well   Behavior During Therapy [facility] for tasks assessed/performed      Past Medical History:  Diagnosis Date  . Arthritis   . Cancer [facility]) [place]    Past Surgical History:  Procedure Laterality Date  . CESAREAN SECTION    . HERNIA REPAIR     51 years old    There were no vitals filed for this visit.      Subjective Assessment - 08/20/16 1106    Subjective no pain in Rt hip at all, pain in Rt glut (points to ischial tuberosity).   Patient Stated Goals improve pain, play tennis after pain   Currently in Pain? No/denies            [facility] PT Assessment - 08/20/16 1156      Posture/Postural Control   Posture Comments Rt hip elevated; difficulty with maintaining internal rotation of Rt hip with activites                     [facility] Adult PT Treatment/Exercise - 08/20/16 1110      Knee/Hip Exercises: Stretches   Passive Hamstring Stretch Right;3 reps;30 seconds   Passive Hamstring Stretch Limitations with full knee extension and knee bent   Hip Flexor Stretch Right;2 reps;30 seconds   Hip Flexor Stretch Limitations supine with quad stretch   Other Knee/Hip Stretches attempted ITB stretch, unable to achieve full stretch     Knee/Hip Exercises: Aerobic   Stationary Bike L3 x 6 min     Knee/Hip Exercises: Standing   Hip Abduction Both;10  reps;Knee straight   Abduction Limitations mod tactile cues for proper technique; x 10 "pulses" at end range   SLS RLE: LLE tap back into full hip flexion x 10 reps; cues for improved stability and control     Knee/Hip Exercises: Supine   Single Leg Bridge Right;10 reps     Knee/Hip Exercises: Prone   Hip Extension Right;10 reps   Hip Extension Limitations 3#; mild increase in pain     Manual Therapy   Manual Therapy Soft tissue mobilization   Manual therapy comments instructed in use of tennis ball for self myofacial release and mobilization   Soft tissue mobilization Rt glut med                     PT Long Term Goals - 08/13/16 1148      PT LONG TERM GOAL #1   Title independent with HEP (09/20/16)   Status On-going     PT LONG TERM GOAL #2   Title report ability to play tennis at 75-100% intensity without increase in pain for improved function (09/20/16)   Status On-going     PT LONG TERM GOAL #3   Title demonstrate at least 4+/5 strength in Rt hip  for improved function and mobility (09/20/16)   Status On-going               Plan - 08/20/16 1159    Clinical Impression Statement Pt reports no hip pain, but hasn't returned to tennis or pure barre at this time.  Increased pain with hip extension and tenderness at ischial tuberosity today but tolerated session well.  Pt with Rt hip elevated and may benefit from trial of Lt heel lift to help; will plan to measure leg length next session.  Advised to try class or tennis this week to see how hip responds.  Educated to stop if pain returns.  Will continue to benefit from PT to maximize function.   PT Treatment/Interventions ADLs/Self Care Home Management;Cryotherapy;Electrical Stimulation;Iontophoresis 4mg /ml Dexamethasone;Moist Heat;Ultrasound;Balance training;Therapeutic exercise;Therapeutic activities;Functional mobility training;Patient/family education;Manual techniques;Taping;Dry needling   PT Next Visit Plan  continue hip strengthening (eccentric), add ir/er strengthening to HEP, manual/ball work   Oncologist with Plan of Care Patient      Patient will benefit from skilled therapeutic intervention in order to improve the following deficits and impairments:  Pain, Increased fascial restricitons, Decreased strength, Impaired flexibility, Decreased mobility  Visit Diagnosis: Pain in right hip  Muscle weakness (generalized)  Other symptoms and signs involving the musculoskeletal system     Problem List Patient Active Problem List   Diagnosis Date Noted  . Right hip pain 08/01/2016        Laureen Abrahams, PT, DPT 08/20/16 12:03 PM     [place], [place], [address] Phone: [phone]   Fax:  [phone]  Name: Andrea Thomas MRN: [id number] Date of Birth: 1965-09-27

## 2016-09-03 ENCOUNTER — Ambulatory Visit (INDEPENDENT_AMBULATORY_CARE_PROVIDER_SITE_OTHER): Payer: BLUE CROSS/BLUE SHIELD | Admitting: Physical Therapy

## 2016-09-03 DIAGNOSIS — M25551 Pain in right hip: Secondary | ICD-10-CM | POA: Diagnosis not present

## 2016-09-03 NOTE — Patient Instructions (Addendum)
Quadriceps (Prone)   On stomach with sheet around ankles, knees together, hips down, pull heels toward bottom. Keep hips flat. Hold __60__ seconds. Repeat _3__ times. Do __3__ sessions per day. CAUTION: Stretch should be gentle, steady and slow.   HIP: Flexors - Supine   Lie on edge of surface. Place leg off the surface, allow knee to bend. Bring other knee toward chest. Hold _60__ seconds. _3__ reps per set, _2-3__ times per day, Rest lowered foot on stool if needed.   Use rolling pin, foam roller and/or tennis ball to release trigger points.  Trigger Point Dry Needling  . What is Trigger Point Dry Needling (DN)? o DN is a physical therapy technique used to treat muscle pain and dysfunction. Specifically, DN helps deactivate muscle trigger points (muscle knots).  o A thin filiform needle is used to penetrate the skin and stimulate the underlying trigger point. The goal is for a local twitch response (LTR) to occur and for the trigger point to relax. No medication of any kind is injected during the procedure.   . What Does Trigger Point Dry Needling Feel Like?  o The procedure feels different for each individual patient. Some patients report that they do not actually feel the needle enter the skin and overall the process is not painful. Very mild bleeding may occur. However, many patients feel a deep cramping in the muscle in which the needle was inserted. This is the local twitch response.   Marland Kitchen How Will I feel after the treatment? o Soreness is normal, and the onset of soreness may not occur for a few hours. Typically this soreness does not last longer than two days.  o Bruising is uncommon, however; ice can be used to decrease any possible bruising.  o In rare cases feeling tired or nauseous after the treatment is normal. In addition, your symptoms may get worse before they get better, this period will typically not last longer than 24 hours.   . What Can I do After My  Treatment? o Increase your hydration by drinking more water for the next 24 hours. o You may place ice or heat on the areas treated that have become sore, however, do not use heat on inflamed or bruised areas. Heat often brings more relief post needling. o You can continue your regular activities, but vigorous activity is not recommended initially after the treatment for 24 hours. o DN is best combined with other physical therapy such as strengthening, stretching, and other therapies.    Precautions:  In some cases, dry needling is done over the lung field. While rare, there is a risk of pneumothorax (punctured lung). Because of this, if you ever experience shortness of breath on exertion, difficulty taking a deep breath, chest pain or a dry cough following dry needling, you should report to an emergency room and tell them that you have been dry needled over the thorax.   Andrea Thomas, PT 09/03/16 12:00 PM [facility] [place], [place], [address] Phone: [phone]   Fax:  [phone]

## 2016-09-03 NOTE — Therapy (Addendum)
[facility] [address] [place], [place], [address] Phone: [phone]   Fax:  [phone]  Physical Therapy Treatment  Patient Details  Name: Andrea Thomas MRN: [id number] Date of Birth: 1965-05-11 Referring Provider: Dr. Teresa Coombs  Encounter Date: 09/03/2016      PT End of Session - 09/03/16 1231    Visit Number 4   Number of Visits 12   Date for PT Re-Evaluation 09/20/16   Authorization Type [employer] - precert required after 19 visits   PT Start Time 1114   PT Stop Time 1205   PT Time Calculation (min) 51 min   Activity Tolerance Patient tolerated treatment well   Behavior During Therapy [facility] for tasks assessed/performed      Past Medical History:  Diagnosis Date  . Arthritis   . Cancer [facility]    Past Surgical History:  Procedure Laterality Date  . CESAREAN SECTION    . HERNIA REPAIR     51 years old    There were no vitals filed for this visit.      Subjective Assessment - 09/03/16 1215    Subjective Patient continues to feel pain in R ischial tuberosity, but states she played tennis for first time in a while so everything hurts (muscle soreness).   Limitations Standing;Walking   How long can you stand comfortably? after working out   How long can you walk comfortably? after working out   Patient Stated Goals improve pain, play tennis after pain   Currently in Pain? No/denies                         [facility] Adult PT Treatment/Exercise - 09/03/16 0001      Knee/Hip Exercises: Stretches   Sports administrator Right;1 rep;30 seconds   Hip Flexor Stretch Right;1 rep;30 seconds   Hip Flexor Stretch Limitations HF release using small ball at counter x 60 seconds   Other Knee/Hip Stretches attempted various QL stretches with best being standing SB     Manual Therapy   Manual Therapy Soft tissue mobilization;Myofascial release   Soft tissue mobilization to R HS, adductors, quads   Myofascial  Release to R HF; xfriction massage to obturatur internus and HS insertion at ischial tub                PT Education - 09/03/16 1225    Education provided Yes   Education Details HEP; dry needling education   Person(s) Educated Patient   Methods Explanation;Demonstration;Handout;Tactile cues;Verbal cues   Comprehension Verbalized understanding;Returned demonstration             PT Long Term Goals - 08/13/16 1148      PT LONG TERM GOAL #1   Title independent with HEP (09/20/16)   Status On-going     PT LONG TERM GOAL #2   Title report ability to play tennis at 75-100% intensity without increase in pain for improved function (09/20/16)   Status On-going     PT LONG TERM GOAL #3   Title demonstrate at least 4+/5 strength in Rt hip for improved function and mobility (09/20/16)   Status On-going               Plan - 09/03/16 1225    Clinical Impression Statement Patient presented today with continued c/o pain in the R ischial tuberosity. She responded well to MFR and STW and was issued further stretches. She has multiple trigger points  throughout R lateral quad, hip adductors and medial HS. She is very tight and tender in the R psoas and benefitted from [facility] today. Self technique was reviewed for HEP. Patient would very much benefit from DN. Pelvic landmarks were even as was leg length. She demonstrates a slight left lateral shift and QL stretch was issued also.   Rehab Potential Poor   PT Treatment/Interventions ADLs/Self Care Home Management;Cryotherapy;Electrical Stimulation;Iontophoresis '4mg'$ /ml Dexamethasone;Moist Heat;Ultrasound;Balance training;Therapeutic exercise;Therapeutic activities;Functional mobility training;Patient/family education;Manual techniques;Taping;Dry needling   PT Next Visit Plan continue hip strengthening (eccentric), add ir/er strengthening to HEP, manual/ball work   PT Home Exercise Plan prone quad stretch, supine HF and standing SB.    Consulted and Agree with Plan of Care Patient      Patient will benefit from skilled therapeutic intervention in order to improve the following deficits and impairments:  Pain, Increased fascial restricitons, Decreased strength, Impaired flexibility, Decreased mobility  Visit Diagnosis: Pain in right hip     Problem List Patient Active Problem List   Diagnosis Date Noted  . Right hip pain 08/01/2016   Madelyn Flavors PT 09/03/2016, 12:33 PM  [facility] [address] [place], [place], [address] Phone: [phone]   Fax:  [phone]  Name: Andrea Thomas MRN: [id number] Date of Birth: [birth year]     PHYSICAL THERAPY DISCHARGE SUMMARY  Visits from Start of Care: 4  Current functional level related to goals / functional outcomes: See above   Remaining deficits: Unknown; pt cx remaining appts and didn't call to reschedule   Education / Equipment: HEP  Plan: Patient agrees to discharge.  Patient goals were not met. Patient is being discharged due to not returning since the last visit.  ?????     Laureen Abrahams, PT, DPT 10/03/16 10:28 AM

## 2016-09-14 ENCOUNTER — Ambulatory Visit: Payer: BLUE CROSS/BLUE SHIELD | Admitting: Sports Medicine

## 2017-07-09 ENCOUNTER — Ambulatory Visit: Payer: 59 | Admitting: Sports Medicine

## 2017-07-09 ENCOUNTER — Encounter: Payer: Self-pay | Admitting: Sports Medicine

## 2017-07-09 ENCOUNTER — Ambulatory Visit: Payer: Self-pay

## 2017-07-09 ENCOUNTER — Ambulatory Visit (INDEPENDENT_AMBULATORY_CARE_PROVIDER_SITE_OTHER): Payer: 59

## 2017-07-09 VITALS — BP 112/80 | HR 93 | Ht 62.5 in | Wt 182.0 lb

## 2017-07-09 DIAGNOSIS — M25551 Pain in right hip: Secondary | ICD-10-CM

## 2017-07-09 DIAGNOSIS — M25561 Pain in right knee: Secondary | ICD-10-CM

## 2017-07-09 NOTE — Patient Instructions (Signed)

## 2017-07-09 NOTE — Progress Notes (Signed)
  Andrea Thomas - 52 y.o. female MRN [id number]  Date of birth: 1966-01-08  Scribe for today's visit: Andrea Thomas, CMA     SUBJECTIVE:  Andrea Thomas is here for Follow-up (R hip pain)  Compared to the last office visit, her previously described symptoms are worsening. She played tennis Tuesday and then Wednesday she noticed stiffness in the R knee. She didn't have any pain on Tuesday while playing tennis. She has noticed some swelling around the knee. She contributes her hip pain to limping because of her knee. She had similar pain in the R hip about 1 year ago. She has been working with a Physiological scientist working on Hotel manager, she has been doing more squats (squats challenge), she hasn't had any trouble until now. She does have pain in the groin. She denies lower back pain.  Current symptoms are moderate-severe (7/10) & are nonradiating She has been taking IBU but isn't supposed to take it d/t interaction with Zoloft. She has been icing and elevated the knee with minimal relief.    ROS Reports night time disturbances. Denies fevers, chills, or night sweats. Denies unexplained weight loss. Denies personal history of cancer. Denies changes in bowel or bladder habits. Denies recent unreported falls. Denies new or worsening dyspnea or wheezing. Denies headaches or dizziness.  Reports numbness, tingling or weakness  In the extremities.  Denies dizziness or presyncopal episodes Reports lower extremity edema    HISTORY & PERTINENT PRIOR DATA:  Prior History reviewed and updated per electronic medical record.  Significant/pertinent history, findings, studies include:  reports that she has never smoked. She has never used smokeless tobacco. No results for input(s): HGBA1C, LABURIC, CREATINE in the last 8760 hours. No specialty comments available. No problems updated.  OBJECTIVE:  VS:  HT:5' 2.5" (158.8 cm)   WT:182 lb (82.6 kg)  BMI:32.74    BP:112/80  HR:93bpm  TEMP: ( )   RESP:94 %   PHYSICAL EXAM: Constitutional: WDWN, Non-toxic appearing. Psychiatric: Alert & appropriately interactive.  Not depressed or anxious appearing. Respiratory: No increased work of breathing.  Trachea Midline Eyes: Pupils are equal.  EOM intact without nystagmus.  No scleral icterus  Vascular Exam: warm to touch no edema  lower extremity neuro exam: unremarkable  MSK Exam: Pain is present within the right leg with internal rotation external rotation but most focally with terminal flexion extension.  Knee is ligamentously stable.  Negative Stinchfield testing.   ASSESSMENT & PLAN:   1. Right hip pain   2. Acute pain of right knee     PLAN: Symptoms most consistent with this likely coming from a intrinsic knee issue although her thigh and groin pain may be contributed from some underlying hip osteoarthritis as well.  We will go ahead and inject her knee today and follow-up with her and see how she progresses with this.  Follow-up: Return in about 6 weeks (around 08/20/2017).      Please see additional documentation for Objective, Assessment and Plan sections. Pertinent additional documentation may be included in corresponding procedure notes, imaging studies, problem based documentation and patient instructions. Please see these sections of the encounter for additional information regarding this visit.  CMA/ATC served as Education administrator during this visit. History, Physical, and Plan performed by medical provider. Documentation and orders reviewed and attested to.      Gerda Diss, [place] Sports Medicine Physician

## 2017-07-09 NOTE — Procedures (Signed)
PROCEDURE NOTE:  Ultrasound Guided: Injection: right knee Images were obtained and interpreted by myself, Teresa Coombs, DO  Images have been saved and stored to PACS system. Images obtained on: GE S7 Ultrasound machine  ULTRASOUND FINDINGS:  moderate effusion   DESCRIPTION OF PROCEDURE:  The patient's clinical condition is marked by substantial pain and/or significant functional disability. Other conservative therapy has not provided relief, is contraindicated, or not appropriate. There is a reasonable likelihood that injection will significantly improve the patient's pain and/or functional impairment.  After discussing the risks, benefits and expected outcomes of the injection and all questions were reviewed and answered, the patient wished to undergo the above named procedure. Verbal consent was obtained.  The ultrasound was used to identify the target structure and adjacent neurovascular structures. The skin was then prepped in sterile fashion and the target structure was injected under direct visualization using sterile technique as below:  PREP: Alcohol, Ethel Chloride, 5cc 1% lidocaine on  1.5 in. Needle APPROACH: superiolateral, stopcock technique, 18g 1.5in.  INJECTATE: 5cc: 1% lidocaine, 2cc: 0.5% marcaine, 2cc: 40mg /mL DepoMedrol   ASPIRATE: 20cc serous, orange tinged   DRESSING: Band-Aid &: 6-inch Ace Wrap  Post procedural instructions including recommending icing and warning signs for infection were reviewed.   This procedure was well tolerated and there were no complications.   IMPRESSION: Succesful Ultrasound Guided: Injection

## 2017-07-10 ENCOUNTER — Ambulatory Visit: Payer: Self-pay | Admitting: *Deleted

## 2017-07-10 NOTE — Telephone Encounter (Signed)
Pt states she was in the office to have fluid removed from the knee. Pt states she noted today that she did have redness/puple color only to her face. Reviewed notes of Dr. Paulla Fore on 3/19 and it was noted that the pt received a steroid injection. Explained to pt that with steroids one of the side effects of the medication is skin flushing(redness) and that this could last for a couple of days. Pt verbalized understanding. No additional concerns or questions voiced at this time.  Reason for Disposition . Caller has medication question only, adult not sick, and triager answers question  Answer Assessment - Initial Assessment Questions 1. SYMPTOMS: "Do you have any symptoms?"     Flushing of face 2. SEVERITY: If symptoms are present, ask "Are they mild, moderate or severe?"     Face red/ purple  Protocols used: MEDICATION QUESTION CALL-A-AH

## 2017-08-20 ENCOUNTER — Ambulatory Visit: Payer: 59 | Admitting: Sports Medicine

## 2017-08-26 ENCOUNTER — Ambulatory Visit: Payer: 59 | Admitting: Sports Medicine

## 2017-08-29 ENCOUNTER — Encounter: Payer: Self-pay | Admitting: Sports Medicine

## 2017-08-29 NOTE — Procedures (Signed)
X-Rays obtained at [facility] Interpreted by myself Gerda Diss, DO) during office visit.  Results were reviewed with the patient at the time of the visit.   3 VIEW X-RAY of: Right knee  FINDINGS:  Well-maintained joint space.  No significant degenerative spurring on the right knee.  She actually has a small amount on the left.  Overall good alignment.  Slight patellar tilt  IMPRESSION:  Negative x-ray

## 2017-09-03 ENCOUNTER — Ambulatory Visit: Payer: 59 | Admitting: Sports Medicine

## 2017-09-03 ENCOUNTER — Ambulatory Visit: Payer: Self-pay

## 2017-09-03 ENCOUNTER — Encounter: Payer: Self-pay | Admitting: Sports Medicine

## 2017-09-03 VITALS — BP 104/76 | HR 74 | Ht 62.5 in | Wt 181.2 lb

## 2017-09-03 DIAGNOSIS — M25561 Pain in right knee: Secondary | ICD-10-CM | POA: Diagnosis not present

## 2017-09-03 DIAGNOSIS — M25551 Pain in right hip: Secondary | ICD-10-CM | POA: Diagnosis not present

## 2017-09-03 NOTE — Procedures (Signed)
PROCEDURE NOTE:  Ultrasound Guided: Injection: Right hip, intra-articular Images were obtained and interpreted by myself, Teresa Coombs, DO  Images have been saved and stored to PACS system. Images obtained on: GE S7 Ultrasound machine    ULTRASOUND FINDINGS:  Small effusion.  DESCRIPTION OF PROCEDURE:  The patient's clinical condition is marked by substantial pain and/or significant functional disability. Other conservative therapy has not provided relief, is contraindicated, or not appropriate. There is a reasonable likelihood that injection will significantly improve the patient's pain and/or functional impairment.   After discussing the risks, benefits and expected outcomes of the injection and all questions were reviewed and answered, the patient wished to undergo the above named procedure.  Verbal consent was obtained.  The ultrasound was used to identify the target structure and adjacent neurovascular structures. The skin was then prepped in sterile fashion and the target structure was injected under direct visualization using sterile technique as below:  PREP: Alcohol and Ethel Chloride APPROACH: direct, single injection, 22g 3.5 in. INJECTATE: 5 cc 1% lidocaine, 2 cc 0.5% Marcaine, 1 cc 40mg /mL DepoMedrol and 1 cc 40 mg/mL Kenalog ASPIRATE: None DRESSING: Band-Aid  Post procedural instructions including recommending icing and warning signs for infection were reviewed.    This procedure was well tolerated and there were no complications.   IMPRESSION: Succesful Ultrasound Guided: Injection

## 2017-09-03 NOTE — Patient Instructions (Signed)

## 2017-09-03 NOTE — Progress Notes (Signed)
Andrea Thomas. Andrea Thomas, [facility] - 52 y.o. female MRN [id number]  Date of birth: 06-25-1965  Visit Date: 09/03/2017  PCP: No primary care provider on file.   Referred by: No ref. provider found  Scribe for today's visit: Josepha Pigg, CMA     SUBJECTIVE:  Andrea Thomas is here for Follow-up (R knee pain, R hip pain)  07/09/17: Compared to the last office visit, her previously described symptoms are worsening. She played tennis Tuesday and then Wednesday she noticed stiffness in the R knee. She didn't have any pain on Tuesday while playing tennis. She has noticed some swelling around the knee. She contributes her hip pain to limping because of her knee. She had similar pain in the R hip about 1 year ago. She has been working with a Physiological scientist working on Hotel manager, she has been doing more squats (squats challenge), she hasn't had any trouble until now. She does have pain in the groin. She denies lower back pain.  Current symptoms are moderate-severe (7/10) & are nonradiating She has been taking IBU but isn't supposed to take it d/t interaction with Zoloft. She has been icing and elevated the knee with minimal relief.   09/03/17: R knee Compared to the last office visit, her previously described symptoms are improving. She is having some tenderness on the posterior aspect of the knee x 1 month. The swelling around the knee has gone down.  Current symptoms are mild & are nonradiating No current medications or modalities for the pain.  She received steroid injection 07/09/17 and responded well.   R hip Compared to the last office visit, her previously described symptoms are improving  Current symptoms are mild & are non-radiating No current medications or modalities for the pain.   ROS Denies night time disturbances. Denies fevers, chills, or night sweats. Denies unexplained weight  loss. Denies personal history of cancer. Denies changes in bowel or bladder habits. Denies recent unreported falls. Denies new or worsening dyspnea or wheezing. Denies headaches or dizziness.  Denies numbness, tingling or weakness  In the extremities.  Denies dizziness or presyncopal episodes Denies lower extremity edema    HISTORY & PERTINENT PRIOR DATA:  Prior History reviewed and updated per electronic medical record.  Significant/pertinent history, findings, studies include:  reports that she has never smoked. She has never used smokeless tobacco. No results for input(s): HGBA1C, LABURIC, CREATINE in the last 8760 hours. No specialty comments available. No problems updated.  OBJECTIVE:  VS:  HT:5' 2.5" (158.8 cm)   WT:181 lb 3.2 oz (82.2 kg)  BMI:32.59    BP:104/76  HR:74bpm  TEMP: ( )  RESP:96 %   PHYSICAL EXAM: Constitutional: WDWN, Non-toxic appearing. Psychiatric: Alert & appropriately interactive.  Not depressed or anxious appearing. Respiratory: No increased work of breathing.  Trachea Midline Eyes: Pupils are equal.  EOM intact without nystagmus.  No scleral icterus  Vascular Exam: warm to touch no edema  lower extremity neuro exam: unremarkable normal strength normal sensation  MSK Exam: Negative straight leg raise.  She has pain with Bosie Clos testing logroll FADIR testing.   ASSESSMENT & PLAN:   1. Right hip pain   2. Acute pain of right knee     PLAN: Intra-articular hip injection performed today.  Avoid exacerbating activities and follow-up in 8 weeks for consideration of repeat injection versus further diagnostic imaging  Follow-up: Return in about 8 weeks (  around 10/29/2017).        Please see additional documentation for Objective, Assessment and Plan sections. Pertinent additional documentation may be included in corresponding procedure notes, imaging studies, problem based documentation and patient instructions. Please see these  sections of the encounter for additional information regarding this visit.  CMA/ATC served as Education administrator during this visit. History, Physical, and Plan performed by medical provider. Documentation and orders reviewed and attested to.      Gerda Diss, [place] Sports Medicine Physician

## 2017-10-29 ENCOUNTER — Ambulatory Visit: Payer: 59 | Admitting: Sports Medicine

## 2017-10-30 ENCOUNTER — Encounter: Payer: Self-pay | Admitting: Sports Medicine

## 2017-11-04 ENCOUNTER — Encounter: Payer: 59 | Admitting: Obstetrics and Gynecology

## 2017-11-15 ENCOUNTER — Other Ambulatory Visit (HOSPITAL_COMMUNITY)
Admission: RE | Admit: 2017-11-15 | Discharge: 2017-11-15 | Disposition: A | Discharge status: Home / Self Care | Payer: 59 | Source: Ambulatory Visit | Attending: Obstetrics and Gynecology | Admitting: Obstetrics and Gynecology

## 2017-11-15 ENCOUNTER — Other Ambulatory Visit: Payer: Self-pay

## 2017-11-15 ENCOUNTER — Ambulatory Visit: Payer: 59 | Admitting: Obstetrics and Gynecology

## 2017-11-15 ENCOUNTER — Encounter: Payer: Self-pay | Admitting: Obstetrics and Gynecology

## 2017-11-15 VITALS — BP 134/80 | HR 64 | Resp 14 | Ht 62.5 in | Wt 181.0 lb

## 2017-11-15 DIAGNOSIS — Z1211 Encounter for screening for malignant neoplasm of colon: Secondary | ICD-10-CM

## 2017-11-15 DIAGNOSIS — Z01419 Encounter for gynecological examination (general) (routine) without abnormal findings: Secondary | ICD-10-CM | POA: Insufficient documentation

## 2017-11-15 DIAGNOSIS — N912 Amenorrhea, unspecified: Secondary | ICD-10-CM | POA: Diagnosis not present

## 2017-11-15 NOTE — Patient Instructions (Signed)

## 2017-11-15 NOTE — Progress Notes (Signed)
52 y.o. G40P0002 Married Caucasian female here as a new patient for annual exam. Patient has seen Dr. Quincy Simmonds in the past. Patient states that she possibly has ringworm on her right breast.  Has done Aloe Vera for treatment of this.   On LoLoEstrin.  No hot flashes anymore but feels hot all of time.  No menses for years.  Had hormonal testing with PCP or Dr. Alwyn Pea and they stated she is done with her menstruation.  No vaginal dryness.   No bladder or bowel problems.  May leak if she sneezes.  Labs with PCP next week.   PCP: Dr. Ernie Hew    Patient's last menstrual period was 12/18/2014.           Sexually active: Yes.    The current method of family planning is post menopausal status.    Exercising: Yes.    walking, tennis, and gym Smoker:  no  Health Maintenance: Pap:  10/13/12 pap smear negative  Per Epic  History of abnormal Pap:  no MMG:  07/31/16 BIRADS 1 negative/density c.    Colonoscopy:  never BMD:   n/a  Result  n/a TDaP:  November 2017 per patient Gardasil:   n/a HIV: negative before marriage Hep C: never Screening Labs:  PCP   reports that she has never smoked. She has never used smokeless tobacco. She reports that she drinks about 1.8 - 2.4 oz of alcohol per week. She reports that she does not use drugs.  Past Medical History:  Diagnosis Date  . Arthritis   . Cancer [facility]) [place] Sink    Past Surgical History:  Procedure Laterality Date  . BASAL CELL CARCINOMA EXCISION    . CESAREAN SECTION    . HERNIA REPAIR     52 years old    Current Outpatient Medications  Medication Sig Dispense Refill  . LO LOESTRIN FE 1 MG-10 MCG / 10 MCG tablet   0  . sertraline (ZOLOFT) 50 MG tablet Take 50 mg by mouth daily.  0  . valACYclovir (VALTREX) 1000 MG tablet      No current facility-administered medications for this visit.     Family History  Problem Relation Age of Onset  . Cancer Father        lymphoma  . Heart disease Father   . Kidney failure Father   .  Stroke Father   . Skin cancer Maternal Uncle     Review of Systems  Constitutional: Negative.   HENT: Negative.   Eyes: Negative.   Respiratory: Negative.   Cardiovascular: Negative.   Gastrointestinal: Negative.   Endocrine: Negative.   Genitourinary: Negative.   Musculoskeletal: Negative.   Skin:       Rash on right breast  Allergic/Immunologic: Negative.   Neurological: Negative.   Hematological: Negative.   Psychiatric/Behavioral: Negative.     Exam:   BP 134/80 (BP Location: Right Arm, Patient Position: Sitting, Cuff Size: Large)   Pulse 64   Resp 14   Ht 5' 2.5" (1.588 m)   Wt 181 lb (82.1 kg)   LMP 12/18/2014 Comment: Pregnancy Test Waiver signed 08/01/16  BMI 32.58 kg/m     General appearance: alert, cooperative and appears stated age Head: Normocephalic, without obvious abnormality, atraumatic Neck: no adenopathy, supple, symmetrical, trachea midline and thyroid normal to inspection and palpation Lungs: clear to auscultation bilaterally Breasts: left breast with normal appearance and right breast with anular target raised scaling rash measuring 3 cm, no masses or tenderness, No  nipple retraction or dimpling, No nipple discharge or bleeding, No axillary or supraclavicular adenopathy Heart: regular rate and rhythm Abdomen: soft, non-tender; no masses, no organomegaly Extremities: extremities normal, atraumatic, no cyanosis or edema Skin: Skin color, texture, turgor normal. No rashes or lesions Lymph nodes: Cervical, supraclavicular, and axillary nodes normal. No abnormal inguinal nodes palpated Neurologic: Grossly normal  Pelvic: External genitalia:  no lesions              Urethra:  normal appearing urethra with no masses, tenderness or lesions              Bartholins and Skenes: normal                 Vagina: normal appearing vagina with normal color and discharge, no lesions              Cervix: no lesions              Pap taken: Yes.   Bimanual Exam:   Uterus:  normal size, contour, position, consistency, mobility, non-tender              Adnexa: no mass, fullness, tenderness              Rectal exam: Yes.  .  Confirms.              Anus:  normal sphincter tone, no lesions  Chaperone was present for exam.  Assessment:   Well woman visit with normal exam. Amenorrhea on LoLoestrin.  Menopausal? Target rash of right breast.   Plan: Mammogram screening at breast Center.  Recommended self breast awareness. Pap and HR HPV as above. Guidelines for Calcium, Vitamin D, regular exercise program including cardiovascular and weight bearing exercise. Stop OCPs and check FSH and E2 in 2 weeks.  To dermatology.  Referral for colonoscopy.  Labs with PCP.  Follow up annually and prn.  After visit summary provided.

## 2017-11-19 LAB — CYTOLOGY - PAP
Adequacy: ABSENT
Diagnosis: NEGATIVE
HPV (WINDOPATH): NOT DETECTED

## 2017-12-17 ENCOUNTER — Encounter: Payer: Self-pay | Admitting: Obstetrics and Gynecology

## 2017-12-19 ENCOUNTER — Other Ambulatory Visit (INDEPENDENT_AMBULATORY_CARE_PROVIDER_SITE_OTHER): Payer: 59

## 2017-12-19 DIAGNOSIS — N912 Amenorrhea, unspecified: Secondary | ICD-10-CM

## 2017-12-20 LAB — ESTRADIOL: ESTRADIOL: 10.3 pg/mL

## 2017-12-20 LAB — FOLLICLE STIMULATING HORMONE: FSH: 93.4 m[IU]/mL

## 2018-01-02 NOTE — Progress Notes (Signed)
GYNECOLOGY  VISIT   HPI: 52 y.o.   Married  Caucasian  female   G2P0002 with Patient's last menstrual period was 12/18/2014.   here for hormone replacement therapy   Stopped low dose OCPs and had hormonal testing.  E2 10 and FSH 93.4.  Feels hot all the time.  Night sweats.  Not sleeping well.  No vaginal dryness issues.   Needs mammogram.   Taking Zoloft 50 mg.  Rash under her arm was fungal and is resolved with treatment.   GYNECOLOGIC HISTORY: Patient's last menstrual period was 12/18/2014. Contraception:  Post menopausal Menopausal hormone therapy:  OCP Last mammogram:  07/31/2016 BI-RADS CATEGORY  1: Negative Last pap smear:   11/15/2017 normal        OB History    Gravida  2   Para  2   Term  0   Preterm  0   AB  0   Living  2     SAB  0   TAB  0   Ectopic  0   Multiple  0   Live Births  0              Patient Active Problem List   Diagnosis Date Noted  . Candidiasis of mouth 01/03/2018  . Insomnia 01/03/2018  . Menopausal syndrome 01/03/2018  . Vaginitis 01/03/2018  . Right hip pain 08/01/2016    Past Medical History:  Diagnosis Date  . Arthritis   . Cancer [facility]) [place] [facility]ink    Past Surgical History:  Procedure Laterality Date  . BASAL CELL CARCINOMA EXCISION    . CESAREAN SECTION    . HERNIA REPAIR     52 years old    Current Outpatient Medications  Medication Sig Dispense Refill  . sertraline (ZOLOFT) 50 MG tablet Take 50 mg by mouth daily.  0  . valACYclovir (VALTREX) 1000 MG tablet      No current facility-administered medications for this visit.      ALLERGIES: Penicillin g  Family History  Problem Relation Age of Onset  . Cancer Father        lymphoma  . Heart disease Father   . Kidney failure Father   . Stroke Father   . Skin cancer Maternal Uncle     Social History   Socioeconomic History  . Marital status: Married    Spouse name: Not on file  . Number of children: Not on file  . Years of education:  Not on file  . Highest education level: Not on file  Occupational History  . Not on file  Social Needs  . Financial resource strain: Not on file  . Food insecurity:    Worry: Not on file    Inability: Not on file  . Transportation needs:    Medical: Not on file    Non-medical: Not on file  Tobacco Use  . Smoking status: Never Smoker  . Smokeless tobacco: Never Used  Substance and Sexual Activity  . Alcohol use: Yes    Alcohol/week: 3.0 - 4.0 standard drinks    Types: 3 - 4 Glasses of wine per week  . Drug use: No  . Sexual activity: Yes    Birth control/protection: Post-menopausal  Lifestyle  . Physical activity:    Days per week: Not on file    Minutes per session: Not on file  . Stress: Not on file  Relationships  . Social connections:    Talks on phone: Not on file  Gets together: Not on file    Attends religious service: Not on file    Active member of club or organization: Not on file    Attends meetings of clubs or organizations: Not on file    Relationship status: Not on file  . Intimate partner violence:    Fear of current or ex partner: Not on file    Emotionally abused: Not on file    Physically abused: Not on file    Forced sexual activity: Not on file  Other Topics Concern  . Not on file  Social History Narrative  . Not on file    Review of Systems  Constitutional: Negative.   HENT: Positive for dental problem.   Eyes: Negative.   Respiratory: Negative.   Cardiovascular: Negative.   Gastrointestinal: Negative.   Endocrine: Positive for cold intolerance and heat intolerance.  Genitourinary: Positive for frequency.       Nocturia  Musculoskeletal: Negative.   Skin: Negative.   Allergic/Immunologic: Negative.   Neurological: Negative.   Hematological: Negative.   Psychiatric/Behavioral: Negative.   All other systems reviewed and are negative.   PHYSICAL EXAMINATION:    BP (!) 142/94   Pulse 88   Wt 185 lb (83.9 kg)   LMP 12/18/2014  Comment: Pregnancy Test Waiver signed 08/01/16  BMI 33.30 kg/m     General appearance: alert, cooperative and appears stated age  ASSESSMENT  Menopausal symptoms.   PLAN  Menopause and changes with cardiovascular, bone, and sexual health reviewed. We discussed HRT and SSRIs.  HRT options presented and WHI data reviewed showing increased risks of stroke, DVT, PE, MI, and breast.  Will start Vivelle Dot 0.1 mg twice weekly and Prometrium 100 mg nightly.  She will update her mammogram and than receive refills if normal. FU in 3 months.  Brochure on menopause to patient.     An After Visit Summary was printed and given to the patient.  ___25___ minutes face to face time of which over 50% was spent in counseling.

## 2018-01-03 ENCOUNTER — Encounter: Payer: Self-pay | Admitting: Obstetrics and Gynecology

## 2018-01-03 ENCOUNTER — Ambulatory Visit (INDEPENDENT_AMBULATORY_CARE_PROVIDER_SITE_OTHER): Payer: 59 | Admitting: Obstetrics and Gynecology

## 2018-01-03 ENCOUNTER — Other Ambulatory Visit: Payer: Self-pay | Admitting: Obstetrics and Gynecology

## 2018-01-03 VITALS — BP 142/94 | HR 88 | Wt 185.0 lb

## 2018-01-03 DIAGNOSIS — Z1231 Encounter for screening mammogram for malignant neoplasm of breast: Secondary | ICD-10-CM

## 2018-01-03 DIAGNOSIS — B370 Candidal stomatitis: Secondary | ICD-10-CM | POA: Insufficient documentation

## 2018-01-03 DIAGNOSIS — Z1239 Encounter for other screening for malignant neoplasm of breast: Secondary | ICD-10-CM

## 2018-01-03 DIAGNOSIS — G4700 Insomnia, unspecified: Secondary | ICD-10-CM | POA: Insufficient documentation

## 2018-01-03 DIAGNOSIS — N760 Acute vaginitis: Secondary | ICD-10-CM | POA: Insufficient documentation

## 2018-01-03 DIAGNOSIS — N951 Menopausal and female climacteric states: Secondary | ICD-10-CM | POA: Diagnosis not present

## 2018-01-03 MED ORDER — PROGESTERONE MICRONIZED 100 MG PO CAPS: 100.0000 mg | ORAL_CAPSULE | Freq: Every day | ORAL | 0 refills | Status: DC

## 2018-01-03 MED ORDER — ESTRADIOL 0.1 MG/24HR TD PTTW: 1.0000 | MEDICATED_PATCH | TRANSDERMAL | 0 refills | Status: DC

## 2018-01-03 NOTE — Patient Instructions (Addendum)
NO lotion powder or deodorant, bring insurance card. Wear 2 pieces of clothing (no dress)  Appointment Monday September 16, at [address], [place],  [address]  [phone]  Contact your insurance and see if they will cover the "TOMO" charge.  If they do not it will cost $131.

## 2018-01-05 ENCOUNTER — Other Ambulatory Visit: Payer: Self-pay | Admitting: Obstetrics and Gynecology

## 2018-01-05 DIAGNOSIS — Z1211 Encounter for screening for malignant neoplasm of colon: Secondary | ICD-10-CM

## 2018-01-06 ENCOUNTER — Ambulatory Visit
Admission: RE | Admit: 2018-01-06 | Discharge: 2018-01-06 | Disposition: A | Discharge status: Home / Self Care | Payer: 59 | Source: Ambulatory Visit | Attending: Obstetrics and Gynecology | Admitting: Obstetrics and Gynecology

## 2018-01-06 ENCOUNTER — Other Ambulatory Visit: Payer: Self-pay | Admitting: Obstetrics and Gynecology

## 2018-01-06 DIAGNOSIS — Z1231 Encounter for screening mammogram for malignant neoplasm of breast: Secondary | ICD-10-CM

## 2018-01-06 MED ORDER — PROGESTERONE MICRONIZED 100 MG PO CAPS: 100.0000 mg | ORAL_CAPSULE | Freq: Every day | ORAL | 10 refills | Status: DC

## 2018-01-06 MED ORDER — ESTRADIOL 0.1 MG/24HR TD PTTW: 1.0000 | MEDICATED_PATCH | TRANSDERMAL | 10 refills | Status: DC

## 2018-01-29 ENCOUNTER — Encounter: Payer: Self-pay | Admitting: Gastroenterology

## 2018-02-27 ENCOUNTER — Telehealth: Payer: Self-pay

## 2018-02-27 NOTE — Telephone Encounter (Signed)
No show for 9:00 am pre-visit appointment. Left a vm on cell number to please call back by 5pm today.

## 2018-02-28 ENCOUNTER — Encounter: Payer: Self-pay | Admitting: Gastroenterology

## 2018-03-13 ENCOUNTER — Encounter: Payer: 59 | Admitting: Gastroenterology

## 2018-04-03 NOTE — Progress Notes (Signed)
GYNECOLOGY  VISIT   HPI: 52 y.o.   Married  Caucasian  female   G2P0002 with Patient's last menstrual period was 12/18/2014.   here for 3 month follow up.  Sleeping well and not having hot flashes.  Feels a little tired in the am.   No bleeding or patch problems.   Does have some increased fluid in her legs.   Does a lot of traveling long distance by plane.  GYNECOLOGIC HISTORY: Patient's last menstrual period was 12/18/2014. Contraception:  Postmenopausal Menopausal hormone therapy: Vivelle Dot 0.1mg , Prometrium 100mg  Last mammogram: 01-06-18 3D Neg/density C/BiRads1 Last pap smear:   11/15/17 Neg:Neg HR HPV                               10-13-12 Neg OB History    Gravida  2   Para  2   Term  0   Preterm  0   AB  0   Living  2     SAB  0   TAB  0   Ectopic  0   Multiple  0   Live Births  0              Patient Active Problem List   Diagnosis Date Noted  . Candidiasis of mouth 01/03/2018  . Insomnia 01/03/2018  . Menopausal syndrome 01/03/2018  . Vaginitis 01/03/2018  . Right hip pain 08/01/2016    Past Medical History:  Diagnosis Date  . Arthritis   . Cancer [facility]) [place]    Past Surgical History:  Procedure Laterality Date  . BASAL CELL CARCINOMA EXCISION    . CESAREAN SECTION    . HERNIA REPAIR     52 years old    Current Outpatient Medications  Medication Sig Dispense Refill  . estradiol (VIVELLE-DOT) 0.1 MG/24HR patch Place 1 patch (0.1 mg total) onto the skin 2 (two) times a week. 8 patch 10  . progesterone (PROMETRIUM) 100 MG capsule Take 1 capsule (100 mg total) by mouth daily. 30 capsule 10  . sertraline (ZOLOFT) 50 MG tablet Take 50 mg by mouth daily.  0  . valACYclovir (VALTREX) 1000 MG tablet      No current facility-administered medications for this visit.      ALLERGIES: Penicillin g  Family History  Problem Relation Age of Onset  . Cancer Father        lymphoma  . Heart disease Father   . Kidney failure Father    . Stroke Father   . Skin cancer Maternal Uncle     Social History   Socioeconomic History  . Marital status: Married    Spouse name: Not on file  . Number of children: Not on file  . Years of education: Not on file  . Highest education level: Not on file  Occupational History  . Not on file  Social Needs  . Financial resource strain: Not on file  . Food insecurity:    Worry: Not on file    Inability: Not on file  . Transportation needs:    Medical: Not on file    Non-medical: Not on file  Tobacco Use  . Smoking status: Never Smoker  . Smokeless tobacco: Never Used  Substance and Sexual Activity  . Alcohol use: Yes    Alcohol/week: 3.0 - 4.0 standard drinks    Types: 3 - 4 Glasses of wine per week  . Drug use: No  .  Sexual activity: Yes    Birth control/protection: Post-menopausal  Lifestyle  . Physical activity:    Days per week: Not on file    Minutes per session: Not on file  . Stress: Not on file  Relationships  . Social connections:    Talks on phone: Not on file    Gets together: Not on file    Attends religious service: Not on file    Active member of club or organization: Not on file    Attends meetings of clubs or organizations: Not on file    Relationship status: Not on file  . Intimate partner violence:    Fear of current or ex partner: Not on file    Emotionally abused: Not on file    Physically abused: Not on file    Forced sexual activity: Not on file  Other Topics Concern  . Not on file  Social History Narrative  . Not on file    Review of Systems  All other systems reviewed and are negative.   PHYSICAL EXAMINATION:    BP 110/68 (BP Location: Right Arm, Patient Position: Sitting, Cuff Size: Large)   Pulse 70   Ht 5' 2.5" (1.588 m)   Wt 182 lb (82.6 kg)   LMP 12/18/2014 Comment: Pregnancy Test Waiver signed 08/01/16  BMI 32.76 kg/m     General appearance: alert, cooperative and appears stated age   ASSESSMENT  HRT.  Menopausal  symptoms controlled.  PLAN  Continue Minivelle 0.1 mg twice weekly and Prometrium 100 mg daily at hs (will take earlier). We discussed alternatives for progesterone if sleepiness continues.  We discussed compression hose for long distance travel. Fu for annual exams and prn.    An After Visit Summary was printed and given to the patient.  __15____ minutes face to face time of which over 50% was spent in counseling.

## 2018-04-04 ENCOUNTER — Other Ambulatory Visit: Payer: Self-pay

## 2018-04-04 ENCOUNTER — Ambulatory Visit (INDEPENDENT_AMBULATORY_CARE_PROVIDER_SITE_OTHER): Payer: 59 | Admitting: Obstetrics and Gynecology

## 2018-04-04 ENCOUNTER — Encounter: Payer: Self-pay | Admitting: Obstetrics and Gynecology

## 2018-04-04 ENCOUNTER — Encounter: Payer: 59 | Admitting: Gastroenterology

## 2018-04-04 VITALS — BP 110/68 | HR 70 | Ht 62.5 in | Wt 182.0 lb

## 2018-04-04 DIAGNOSIS — Z79890 Hormone replacement therapy (postmenopausal): Secondary | ICD-10-CM

## 2018-11-13 ENCOUNTER — Other Ambulatory Visit: Payer: Self-pay

## 2018-11-17 ENCOUNTER — Ambulatory Visit: Payer: 59 | Admitting: Obstetrics and Gynecology

## 2018-11-17 ENCOUNTER — Other Ambulatory Visit: Payer: Self-pay

## 2018-11-17 ENCOUNTER — Encounter: Payer: Self-pay | Admitting: Obstetrics and Gynecology

## 2018-11-17 VITALS — BP 128/72 | HR 76 | Temp 97.8°F | Resp 12 | Ht 62.5 in | Wt 184.4 lb

## 2018-11-17 DIAGNOSIS — Z01419 Encounter for gynecological examination (general) (routine) without abnormal findings: Secondary | ICD-10-CM | POA: Diagnosis not present

## 2018-11-17 DIAGNOSIS — Z1211 Encounter for screening for malignant neoplasm of colon: Secondary | ICD-10-CM | POA: Diagnosis not present

## 2018-11-17 MED ORDER — PROGESTERONE MICRONIZED 100 MG PO CAPS: 100.0000 mg | ORAL_CAPSULE | Freq: Every day | ORAL | 3 refills | Status: DC

## 2018-11-17 MED ORDER — ESTRADIOL 1 MG PO TABS: 1.0000 mg | ORAL_TABLET | Freq: Every day | ORAL | 3 refills | Status: DC

## 2018-11-17 NOTE — Patient Instructions (Signed)

## 2018-11-17 NOTE — Progress Notes (Signed)
53 y.o. G42P0002 Married Caucasian female here for annual exam.    No health changes.   Transdermal patch is coming off.  Wants an alternative.  Some hot flashes.   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  Taking Zoloft through PCP for panic attacks when her father was dying.   Labs with PCP.   PCP: Rachell Cipro, MD     Patient's last menstrual period was 12/18/2014.           Sexually active: Yes.    The current method of family planning is post menopausal status.    Exercising: Yes.    walking and workout with trainer Smoker:  no  Health Maintenance: Pap:  11/15/17 Neg:Neg HR HPV History of abnormal Pap:  no MMG:  01/06/18 BIRADS 1 negative/density c Colonoscopy:  Never.   TDaP:  2017 HIV: negative in the past Hep C: never Screening Labs: PCP   reports that she has never smoked. She has never used smokeless tobacco. She reports current alcohol use of about 3.0 - 4.0 standard drinks of alcohol per week. She reports that she does not use drugs.  Past Medical History:  Diagnosis Date  . Arthritis   . Cancer [facility]) [facility]  . History of panic attacks   . HSV-1 infection     Past Surgical History:  Procedure Laterality Date  . BASAL CELL CARCINOMA EXCISION    . CESAREAN SECTION    . HERNIA REPAIR     53 years old    Current Outpatient Medications  Medication Sig Dispense Refill  . progesterone (PROMETRIUM) 100 MG capsule Take 1 capsule (100 mg total) by mouth daily. 90 capsule 3  . sertraline (ZOLOFT) 50 MG tablet Take 50 mg by mouth daily.  0  . valACYclovir (VALTREX) 1000 MG tablet     . estradiol (ESTRACE) 1 MG tablet Take 1 tablet (1 mg total) by mouth daily. 90 tablet 3   No current facility-administered medications for this visit.     Family History  Problem Relation Age of Onset  . Cancer Father        lymphoma  . Heart disease Father   . Kidney failure Father   . Stroke Father   . Skin cancer Maternal Uncle     Review of Systems   Constitutional: Negative.   HENT: Negative.   Eyes: Negative.   Respiratory: Negative.   Cardiovascular: Negative.   Gastrointestinal: Negative.   Endocrine: Negative.   Genitourinary: Negative.   Musculoskeletal: Negative.   Skin: Negative.   Allergic/Immunologic: Negative.   Neurological: Negative.   Hematological: Negative.   Psychiatric/Behavioral: Negative.     Exam:   BP 128/72 (BP Location: Right Arm, Patient Position: Sitting, Cuff Size: Normal)   Pulse 76   Temp 97.8 F (36.6 C) (Temporal)   Resp 12   Ht 5' 2.5" (1.588 m)   Wt 184 lb 6.4 oz (83.6 kg)   LMP 12/18/2014 Comment: Pregnancy Test Waiver signed 08/01/16  BMI 33.19 kg/m     General appearance: alert, cooperative and appears stated age Head: normocephalic, without obvious abnormality, atraumatic Neck: no adenopathy, supple, symmetrical, trachea midline and thyroid normal to inspection and palpation Lungs: clear to auscultation bilaterally Breasts: normal appearance, no masses or tenderness, No nipple retraction or dimpling, No nipple discharge or bleeding, No axillary adenopathy Heart: regular rate and rhythm Abdomen: soft, non-tender; no masses, no organomegaly Extremities: extremities normal, atraumatic, no cyanosis or edema Skin: skin color, texture,  turgor normal. No rashes or lesions Lymph nodes: cervical, supraclavicular, and axillary nodes normal. Neurologic: grossly normal  Pelvic: External genitalia:  no lesions              No abnormal inguinal nodes palpated.              Urethra:  normal appearing urethra with no masses, tenderness or lesions              Bartholins and Skenes: normal                 Vagina: normal appearing vagina with normal color and discharge, no lesions              Cervix: no lesions              Pap taken: No. Bimanual Exam:  Uterus:  normal size, contour, position, consistency, mobility, non-tender              Adnexa: no mass, fullness, tenderness               Rectal exam: Yes.  .  Confirms.              Anus:  normal sphincter tone, no lesions  Chaperone was present for exam.  Assessment:   Well woman visit with normal exam. HRT.  Hx HSV 1.  Hx panic attacks.   Plan: Mammogram screening discussed. Self breast awareness reviewed. Pap and HR HPV as above. Guidelines for Calcium, Vitamin D, regular exercise program including cardiovascular and weight bearing exercise. Switch to oral Estrace 1 mg daily.  Continue Prometrium 100 mg daily.  Discused WHI and use of HRT which can increase risk of PE, DVT, MI, stroke and breast cancer.   Follow up annually and prn.   After visit summary provided.

## 2018-12-08 ENCOUNTER — Telehealth: Payer: Self-pay | Admitting: Obstetrics and Gynecology

## 2018-12-08 ENCOUNTER — Encounter: Payer: Self-pay | Admitting: Obstetrics and Gynecology

## 2018-12-08 NOTE — Telephone Encounter (Signed)
Call to patient. Patient states that the estradiol tablets are causing her terrible side effects. States she is having hot flashes and sweating. Unsure why she is having these side effects, did not experience them on the patch. Patient asking if Dr. Quincy Simmonds recommends trying another tablet or going back on the patch? Patient states she is totally fine going back on the patch because she had no side effects from it. RN advised would review with Dr. Quincy Simmonds and return call. Patient agreeable.   Routing to provider to review and advise. Pharmacy confirmed as [facility] on [address].

## 2018-12-08 NOTE — Telephone Encounter (Signed)
I would have her go back on transdermal patch and use Minivelle 0.1 mg twice weekly. She may want to try name brand to see if the adhesive is better for her.  Ok to give refills for one year.

## 2018-12-08 NOTE — Telephone Encounter (Signed)
Patient sent the following correspondence through [place].  Hi Dr. Quincy Simmonds,  I do not think the Estradiol will work for me. I started it on 7/28, and I am having hot flashes, serious sweating! and waking up hot in the middle of the night. Noooo! Not again'   The patch came off but I had no side effects with it.   Please advise if there is another pill form I can try or if I should go back to the patch. If the best solution is the patch, can you please send in a refill.   Thank you,  [facility]

## 2018-12-09 NOTE — Telephone Encounter (Signed)
Message left to return call to Triage Nurse at [phone].    

## 2018-12-10 ENCOUNTER — Encounter: Payer: Self-pay | Admitting: Obstetrics and Gynecology

## 2018-12-10 MED ORDER — MINIVELLE 0.1 MG/24HR TD PTTW: 1.0000 | MEDICATED_PATCH | TRANSDERMAL | 11 refills | Status: DC

## 2018-12-10 NOTE — Telephone Encounter (Signed)
Call to patient. Reviewed Dr Elza Rafter recommendation to try Minivelle ( brand product) and confirmed pharmacy. She will call back if any further issues.   Encounter closed.

## 2018-12-10 NOTE — Telephone Encounter (Signed)
Patient sent the following message through [place]. Routing to triage to assist patient with request.  Hi Dr. Quincy Simmonds,    Is there any possible way to get the old prescription for the patch or a new sent in today, please?  Thank you,  Andrea Thomas

## 2018-12-15 ENCOUNTER — Telehealth: Payer: Self-pay | Admitting: *Deleted

## 2018-12-15 NOTE — Telephone Encounter (Signed)
Notification received from [place]. PA for Minivelle 0.1 mg patch approved. Valid 12/10/18 -12/10/19.   Patient notified. Copy of notification to Dr. Quincy Simmonds to sign for scan. Patient verbalizes understanding and is agreeable.   Encounter closed.

## 2018-12-16 ENCOUNTER — Other Ambulatory Visit: Payer: Self-pay | Admitting: Obstetrics and Gynecology

## 2018-12-16 DIAGNOSIS — Z1231 Encounter for screening mammogram for malignant neoplasm of breast: Secondary | ICD-10-CM

## 2018-12-26 ENCOUNTER — Other Ambulatory Visit: Payer: Self-pay

## 2018-12-26 DIAGNOSIS — Z20822 Contact with and (suspected) exposure to covid-19: Secondary | ICD-10-CM

## 2018-12-28 LAB — NOVEL CORONAVIRUS, NAA: SARS-CoV-2, NAA: NOT DETECTED

## 2018-12-30 ENCOUNTER — Encounter: Payer: Self-pay | Admitting: Obstetrics and Gynecology

## 2018-12-30 ENCOUNTER — Telehealth: Payer: Self-pay | Admitting: Obstetrics and Gynecology

## 2018-12-30 NOTE — Telephone Encounter (Signed)
Spoke with patient. Advised no manufacture coupons for [employer]. Reviewed saving thru [place] and [place], patients insurance plan is more affordable. Patient request Rx for generic Minivelle 0.1mg  patch to pharmacy on file. Advised I will send to Dr. Quincy Simmonds for final review, f/u with pharamcy for filling. Patient agreeable.    Rx pended for estradiol 0.1mg  patch, apply two times weekly. #8/11RF  Routing to Dr. Quincy Simmonds.

## 2018-12-30 NOTE — Telephone Encounter (Signed)
Patient sent the following message through [place]. Routing to triage to assist patient with request.   [place] [place] [facility]  Phone Number: [phone]        Hi Dr. Quincy Simmonds,  Are there any coupons for the [facility]?  While it does absolutely adhere better, I have had no issues at all, the out of pocket cost monthly is $70+.  If no coupons are available, can you please send in the generic again?  Thank you,  Andrea Thomas

## 2018-12-30 NOTE — Telephone Encounter (Signed)
[place] for generic Rx until annual exam.

## 2018-12-31 MED ORDER — ESTRADIOL 0.1 MG/24HR TD PTTW: 1.0000 | MEDICATED_PATCH | TRANSDERMAL | 10 refills | Status: DC

## 2018-12-31 NOTE — Telephone Encounter (Signed)
Rx for estradiol 0.1mg  patch twice weekly #8/10RF to pharmacy on file.   Encounter closed.

## 2019-01-19 ENCOUNTER — Other Ambulatory Visit: Payer: Self-pay

## 2019-01-19 ENCOUNTER — Ambulatory Visit: Payer: 59 | Admitting: Neurology

## 2019-01-19 ENCOUNTER — Encounter: Payer: Self-pay | Admitting: Neurology

## 2019-01-19 VITALS — BP 140/80 | HR 88 | Ht 63.0 in | Wt 186.0 lb

## 2019-01-19 DIAGNOSIS — G4719 Other hypersomnia: Secondary | ICD-10-CM

## 2019-01-19 DIAGNOSIS — G478 Other sleep disorders: Secondary | ICD-10-CM

## 2019-01-19 DIAGNOSIS — R0600 Dyspnea, unspecified: Secondary | ICD-10-CM

## 2019-01-19 DIAGNOSIS — R0683 Snoring: Secondary | ICD-10-CM

## 2019-01-19 DIAGNOSIS — R519 Headache, unspecified: Secondary | ICD-10-CM

## 2019-01-19 DIAGNOSIS — R351 Nocturia: Secondary | ICD-10-CM

## 2019-01-19 DIAGNOSIS — R0689 Other abnormalities of breathing: Secondary | ICD-10-CM

## 2019-01-19 DIAGNOSIS — R51 Headache: Secondary | ICD-10-CM

## 2019-01-19 DIAGNOSIS — E669 Obesity, unspecified: Secondary | ICD-10-CM

## 2019-01-19 NOTE — Progress Notes (Signed)
Subjective:    Patient ID: Andrea Thomas is a 53 y.o. female.  HPI     Star Age, MD, PhD [facility] [address] [place], [place] [address]  Dear Benjamine Mola, I saw your patient, Andrea Thomas, upon your kind request in my sleep clinic today for initial consultation of her sleep disorder, in particular, concern for underlying obstructive sleep apnea.  The patient is unaccompanied today.  As you know, [name] is a 53 year old right-handed woman with an underlying medical history of allergies, nasal congestion and obesity, who reports snoring and excessive daytime somnolence, morning headaches and waking up with a sense of gasping for air.  I reviewed your telemedicine note from 01/05/2019.  Her Epworth sleepiness score is 8 out of 24, fatigue severity score is 16 out of 63.  She is married and lives with her husband and son 536 yo) and daughter (63 yo).  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  She used to travel quite a bit for work and traveled to [place] earlier this year, she developed severe flulike symptoms in February and early March and tested positive for the flu.  Since then she feels her sleep difficulty and shortness of breath have become worse.  She sometimes feels like she has to breathe harder even during the day and with minimal exertion such as after climbing stairs.  She has been working with a Physiological scientist but put this on hold for a while because she felt she was not breathing well.  She has tried a nebulizer which sometimes helps because she has even noticed some wheezing.  No diagnosis of asthma as an adult or child.  She had a tonsillectomy as a child.  She does not wake up rested.  She is not always very sleepy during the day but does like to nap on the weekends.  Bedtime is generally between 10 and 10:30 PM and rise time around 6:30  AM.  They have several pets in the household, 1 dog, 3 cats, 1 bird and 1 gecko.  She has had nasal drainage and nasal congestion, particularly worse in the humid months and particularly worse in the mornings.  She has no family history of sleep apnea as she recalls.  She is a non-smoker and drinks alcohol in the form of wine, 2-3 times per week and typically no soda, has started drinking some hot tea, and drinks caffeine in the form of coffee, 2 to 3 cups on average.   Her Past Medical History Is Significant For: Past Medical History:  Diagnosis Date  . Arthritis   . 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 History of panic attacks   . HSV-1 infection     Her Past Surgical History Is Significant For: Past Surgical History:  Procedure Laterality Date  . BASAL CELL CARCINOMA EXCISION    . CESAREAN SECTION    . HERNIA REPAIR     53 years old    Her Family History Is Significant For: Family History  Problem Relation Age of Onset  . Cancer Father        lymphoma  . Heart disease Father   . Kidney failure Father   . Stroke Father   . Skin cancer Maternal Uncle     Her Social History Is Significant For: Social History   Socioeconomic History  . Marital status: Married    Spouse  name: Not on file  . Number of children: Not on file  . Years of education: Not on file  . Highest education level: Not on file  Occupational History  . Not on file  Social Needs  . Financial resource strain: Not on file  . Food insecurity    Worry: Not on file    Inability: Not on file  . Transportation needs    Medical: Not on file    Non-medical: Not on file  Tobacco Use  . Smoking status: Never Smoker  . Smokeless tobacco: Never Used  Substance and Sexual Activity  . Alcohol use: Yes    Alcohol/week: 3.0 - 4.0 standard drinks    Types: 3 - 4 Glasses of wine per week  . Drug use: No  . Sexual activity: Yes    Birth control/protection: Post-menopausal  Lifestyle  . Physical activity    Days per week:  Not on file    Minutes per session: Not on file  . Stress: Not on file  Relationships  . Social Herbalist on phone: Not on file    Gets together: Not on file    Attends religious service: Not on file    Active member of club or organization: Not on file    Attends meetings of clubs or organizations: Not on file    Relationship status: Not on file  Other Topics Concern  . Not on file  Social History Narrative  . Not on file    Her Allergies Are:  Allergies  Allergen Reactions  . Penicillin G Rash  :   Her Current Medications Are:  Outpatient Encounter Medications as of 01/19/2019  Medication Sig  . estradiol (VIVELLE-DOT) 0.1 MG/24HR patch Place 1 patch (0.1 mg total) onto the skin 2 (two) times a week.  . progesterone (PROMETRIUM) 100 MG capsule Take 1 capsule (100 mg total) by mouth daily.  . sertraline (ZOLOFT) 50 MG tablet Take 50 mg by mouth daily.  . valACYclovir (VALTREX) 1000 MG tablet    No facility-administered encounter medications on file as of 01/19/2019.   :  Review of Systems:  Out of a complete 14 point review of systems, all are reviewed and negative with the exception of these symptoms as listed below: Review of Systems  Neurological:       Pt presents today to discuss her sleep. Pt has never had a sleep study but does endorse snoring.  Epworth Sleepiness Scale 0= would never doze 1= slight chance of dozing 2= moderate chance of dozing 3= high chance of dozing  Sitting and reading: 0 Watching TV: 1 Sitting inactive in a public place (ex. Theater or meeting): 0 As a passenger in a car for an hour without a break: 3 Lying down to rest in the afternoon: 3 Sitting and talking to someone: 0 Sitting quietly after lunch (no alcohol): 1 In a car, while stopped in traffic: 0 Total: 8     Objective:  Neurological Exam  Physical Exam Physical Examination:   Vitals:   01/19/19 0858  BP: 140/80  Pulse: 88    General Examination: The  patient is a very pleasant 53 y.o. female in no acute distress. She appears well-developed and well-nourished and well groomed.   HEENT: Normocephalic, atraumatic, pupils are equal, round and reactive to light and accommodation. Extraocular tracking is good without limitation to gaze excursion or nystagmus noted. Normal smooth pursuit is noted. Hearing is grossly intact. Face is symmetric with  normal facial animation and normal facial sensation. Speech is clear with no dysarthria noted. There is no hypophonia. There is no lip, neck/head, jaw or voice tremor. Neck is supple with full range of passive and active motion. There are no carotid bruits on auscultation. Oropharynx exam reveals: moderate mouth dryness, good dental hygiene and mild airway crowding, due to Small airway entry and thicker tongue, Mallampati is class III, tonsils are absent, neck circumference is 14-3/8 inches, she has a mild overbite.  Tongue protrudes centrally in palate elevates symmetrically.  Chest: Clear to auscultation without wheezing, rhonchi or crackles noted.  Heart: S1+S2+0, regular and normal without murmurs, rubs or gallops noted.   Abdomen: Soft, non-tender and non-distended with normal bowel sounds appreciated on auscultation.  Extremities: There is no pitting edema in the distal lower extremities bilaterally.   Skin: Warm and dry without trophic changes noted.  Musculoskeletal: exam reveals no obvious joint deformities, tenderness or joint swelling or erythema.   Neurologically:  Mental status: The patient is awake, alert and oriented in all 4 spheres. Her immediate and remote memory, attention, language skills and fund of knowledge are appropriate. There is no evidence of aphasia, agnosia, apraxia or anomia. Speech is clear with normal prosody and enunciation. Thought process is linear. Mood is normal and affect is normal.  Cranial nerves II - XII are as described above under HEENT exam. In addition: shoulder  shrug is normal with equal shoulder height noted. Motor exam: Normal bulk, strength and tone is noted. There is no obvious tremor. Fine motor skills and coordination: grossly intact. Cerebellar testing: No dysmetria or intention tremor on finger to nose testing. Heel to shin is unremarkable bilaterally. There is no truncal or gait ataxia.  Sensory exam: intact to light touch the upper and lower extremities.  Gait, station and balance: She stands easily. No veering to one side is noted. No leaning to one side is noted. Posture is age-appropriate and stance is narrow based. Gait shows normal stride length and normal pace. No problems turning are noted.                Assessment and Plan:  In summary, Dardanella Chute is a very pleasant 53 y.o.-year old female with an underlying medical history of allergies, nasal congestion and obesity, whose history and physical exam are concerning for obstructive sleep apnea (OSA). I had a long chat with the patient about my findings and the diagnosis of OSA, its prognosis and treatment options. We talked about medical treatments, surgical interventions and non-pharmacological approaches. I explained in particular the risks and ramifications of untreated moderate to severe OSA, especially with respect to developing cardiovascular disease down the Road, including congestive heart failure, difficult to treat hypertension, cardiac arrhythmias, or stroke. Even type 2 diabetes has, in part, been linked to untreated OSA. Symptoms of untreated OSA include daytime sleepiness, memory problems, mood irritability and mood disorder such as depression and anxiety, lack of energy, as well as recurrent headaches, especially morning headaches. We talked about trying to maintain a healthy lifestyle in general, as well as the importance of weight control. We also talked about the importance of good sleep hygiene. I recommended the following at this time: sleep study.   I explained  the sleep test procedure to the patient and also outlined possible surgical and non-surgical treatment options of OSA, including the use of a custom-made dental device (which would require a referral to a specialist dentist or oral surgeon), upper airway surgical options, such  as traditional UPPP or a novel less invasive surgical option in the form of Inspire hypoglossal nerve stimulation (which would involve a referral to an ENT surgeon). I also explained the CPAP treatment option to the patient, who indicated that she would be willing to try CPAP if the need arises. I explained the importance of being compliant with PAP treatment, not only for insurance purposes but primarily to improve Her symptoms, and for the patient's long term health benefit, including to reduce Her cardiovascular risks. I answered all her questions today and the patient was in agreement. I plan to see her back after the sleep study is completed and encouraged her to call with any interim questions, concerns, problems or updates.   Thank you very much for allowing me to participate in the care of this nice patient. If I can be of any further assistance to you please do not hesitate to call me at [phone].  Sincerely,   Star Age, MD, PhD

## 2019-01-19 NOTE — Patient Instructions (Signed)

## 2019-01-28 ENCOUNTER — Ambulatory Visit
Admission: RE | Admit: 2019-01-28 | Discharge: 2019-01-28 | Disposition: A | Discharge status: Home / Self Care | Payer: 59 | Source: Ambulatory Visit | Attending: Obstetrics and Gynecology | Admitting: Obstetrics and Gynecology

## 2019-01-28 ENCOUNTER — Other Ambulatory Visit: Payer: Self-pay

## 2019-01-28 DIAGNOSIS — Z1231 Encounter for screening mammogram for malignant neoplasm of breast: Secondary | ICD-10-CM

## 2019-02-02 ENCOUNTER — Ambulatory Visit (INDEPENDENT_AMBULATORY_CARE_PROVIDER_SITE_OTHER): Payer: 59 | Admitting: Neurology

## 2019-02-02 DIAGNOSIS — R519 Headache, unspecified: Secondary | ICD-10-CM

## 2019-02-02 DIAGNOSIS — G4733 Obstructive sleep apnea (adult) (pediatric): Secondary | ICD-10-CM | POA: Diagnosis not present

## 2019-02-02 DIAGNOSIS — R0683 Snoring: Secondary | ICD-10-CM

## 2019-02-02 DIAGNOSIS — E669 Obesity, unspecified: Secondary | ICD-10-CM

## 2019-02-02 DIAGNOSIS — R0689 Other abnormalities of breathing: Secondary | ICD-10-CM

## 2019-02-02 DIAGNOSIS — G478 Other sleep disorders: Secondary | ICD-10-CM

## 2019-02-02 DIAGNOSIS — R0600 Dyspnea, unspecified: Secondary | ICD-10-CM

## 2019-02-02 DIAGNOSIS — R351 Nocturia: Secondary | ICD-10-CM

## 2019-02-02 DIAGNOSIS — G4719 Other hypersomnia: Secondary | ICD-10-CM

## 2019-02-10 ENCOUNTER — Encounter: Payer: Self-pay | Admitting: Neurology

## 2019-02-10 ENCOUNTER — Telehealth: Payer: Self-pay

## 2019-02-10 NOTE — Progress Notes (Signed)
Patient referred by Dr. Ernie Hew, seen by me on 01/19/19, HST on 02/02/19.    Please call and notify the patient that the recent home sleep test showed obstructive sleep apnea. OSA is overall mild, but would be worth treating to see if she feels better after treatment. To that end I recommend treatment for this in the form of autoPAP, which means, that we don't have to bring her in for a sleep study with CPAP, but will let her try an autoPAP machine at home, through a DME company (of her choice, or as per insurance requirement). The DME representative will educate her on how to use the machine, how to put the mask on, etc. I have not put an order in yet, please let me know, how she would like to proceed. Alternative treatment could be weight loss and avoiding the supine sleep position or a dental device. If she would like to pursue dental treatment, I can place a referral to dentistry, a dentist of her choosing, or we can refer to one of the practices we know participate in OSA treatment. Again, let me know.   Star Age, MD, PhD [facility])

## 2019-02-10 NOTE — Telephone Encounter (Signed)
-----   Message from Star Age, MD sent at 02/10/2019  8:28 AM EDT ----- Patient referred by Dr. Ernie Hew, seen by me on 01/19/19, HST on 02/02/19.    Please call and notify the patient that the recent home sleep test showed obstructive sleep apnea. OSA is overall mild, but would be worth treating to see if she feels better after treatment. To that end I recommend treatment for this in the form of autoPAP, which means, that we don't have to bring her in for a sleep study with CPAP, but will let her try an autoPAP machine at home, through a DME company (of her choice, or as per insurance requirement). The DME representative will educate her on how to use the machine, how to put the mask on, etc. I have not put an order in yet, please let me know, how she would like to proceed. Alternative treatment could be weight loss and avoiding the supine sleep position or a dental device. If she would like to pursue dental treatment, I can place a referral to dentistry, a dentist of her choosing, or we can refer to one of the practices we know participate in OSA treatment. Again, let me know.   Star Age, MD, PhD [facility])

## 2019-02-10 NOTE — Procedures (Signed)
Patient Information     First Name: Andrea Last Name: Thomas ID: [id number]  Birth Date: 09-Dec-1965 Age: 53 Gender: Female  Referring Provider: Fanny Bien, MD BMI: 32.8 (W=185 lb, H=5' 3'')  Neck Circ.:  14 '' Epworth:  8/24   Sleep Study Information    Study Date: Feb 02, 2019 S/H/A Version: 001.001.001.001 / 4.1.1528 / 35  History:    53 year old woman with a history of allergies, nasal congestion and obesity, who reports snoring and excessive daytime somnolence, morning headaches and waking up with a sense of gasping for air. Summary & Diagnosis:     Mild OSA Recommendations:      This home sleep test demonstrates overall mild obstructive sleep apnea with a total AHI of 11.4/hour and O2 nadir of 89%. Given the patient's medical history and sleep related complaints, treatment with positive airway pressure is a reasonable choice and will be offered to the patient. Treatment can be achieved in the form of autoPAP trial/titration at home. A  full night CPAP titration study will help with proper treatment settings and mask fitting if needed. Alternative treatments may include weight loss along with avoidance of the supine sleep position, or an oral appliance in appropriate candidates.   Please note that untreated obstructive sleep apnea may carry additional perioperative morbidity. Patients with significant obstructive sleep apnea should receive perioperative PAP therapy and the surgeons and particularly the anesthesiologist should be informed of the diagnosis and the severity of the sleep disordered breathing. The patient should be cautioned not to drive, work at heights, or operate dangerous or heavy equipment when tired or sleepy. Review and reiteration of good sleep hygiene measures should be pursued with any patient. Other causes of the patient's symptoms, including circadian rhythm disturbances, an underlying mood disorder, medication effect and/or an underlying medical problem  cannot be ruled out based on this test. Clinical correlation is recommended.  The patient and her referring provider will be notified of the test results. The patient will be seen in follow up in sleep clinic at [facility] as necessary.  I certify that I have reviewed the raw data recording prior to the issuance of this report in accordance with the standards of the American Academy of Sleep Medicine (AASM).  Star Age, MD, PhD [facility]) Diplomat, ABPN (Neurology and Sleep)           Sleep Summary  Oxygen Saturation Statistics   Start Study Time: End Study Time: Total Recording Time:  10:10:14 PM   5:56:33 AM   7 h, 46 min  Total Sleep Time % REM of Sleep Time:  7 h, 5 min  21.5    Mean: 93 Minimum: 89 Maximum: 98  Mean of Desaturations Nadirs (%):   92  Oxygen Desaturation. %: 4-9 10-20 >20 Total  Events Number Total  25 100.0  0 0.0  0 0.0  25 100.0  Oxygen Saturation: <90 <=88 <85 <80 <70  Duration (minutes): Sleep % 0.0 0.0 0.0 0.0 0.0 0.0 0.0 0.0 0.0 0.0     Respiratory Indices      Total Events REM NREM All Night  pRDI:  133  pAHI:  81 ODI:  25  pAHIc:  0  % CSR: 0.0 40.1 31.6 9.2 0.0 12.9 5.9 2.0 0.0 18.8 11.4 3.5 0.0       Pulse Rate Statistics during Sleep (BPM)      Mean:  71 Minimum: 53  Maximum: 106  Indices are calculated using technically valid sleep time of  7 hrs, 5 min. pRDI/pAHI are calculated using oxi desaturations ? 3%  Body Position Statistics  Position Supine Prone Right Left Non-Supine  Sleep (min) 312.5 0.0 113.0 0.0 113.0  Sleep % 73.4 0.0 26.6 0.0 26.6  pRDI 24.4 N/A 3.2 N/A 3.2  pAHI 15.6 N/A 0.0 N/A 0.0  ODI 4.8 N/A 0.0 N/A 0.0     Snoring Statistics Snoring Level (dB) >40 >50 >60 >70 >80 >Threshold (45)  Sleep (min) 118.8 2.7 0.5 0.1 0.0 18.4  Sleep % 27.9 0.6 0.1 0.0 0.0 4.3    Mean: 41 dB Sleep Stages Chart                  pAHI=11.4                              Mild               Moderate                    Severe                                                 5              15                    30

## 2019-02-10 NOTE — Telephone Encounter (Signed)
I called pt to discuss her sleep study results. No answer, left a message asking her to call me back. 

## 2019-02-11 NOTE — Telephone Encounter (Signed)
I called pt and discussed her sleep study results and recommendations. Pt will think about her treatment options and let us know if she would like to pursue auto-pap. Pt verbalized understanding of results. Pt had no questions at this time but was encouraged to call back if questions arise.

## 2019-06-17 DIAGNOSIS — D225 Melanocytic nevi of trunk: Secondary | ICD-10-CM | POA: Diagnosis not present

## 2019-06-17 DIAGNOSIS — D2272 Melanocytic nevi of left lower limb, including hip: Secondary | ICD-10-CM | POA: Diagnosis not present

## 2019-06-17 DIAGNOSIS — Z86018 Personal history of other benign neoplasm: Secondary | ICD-10-CM | POA: Diagnosis not present

## 2019-06-17 DIAGNOSIS — L570 Actinic keratosis: Secondary | ICD-10-CM | POA: Diagnosis not present

## 2019-06-17 DIAGNOSIS — D2271 Melanocytic nevi of right lower limb, including hip: Secondary | ICD-10-CM | POA: Diagnosis not present

## 2019-07-06 ENCOUNTER — Encounter: Payer: Self-pay | Admitting: Certified Nurse Midwife

## 2019-07-06 ENCOUNTER — Ambulatory Visit: Payer: BC Managed Care – PPO | Admitting: Certified Nurse Midwife

## 2019-07-06 ENCOUNTER — Other Ambulatory Visit: Payer: Self-pay

## 2019-07-06 VITALS — BP 108/64 | HR 68 | Temp 97.0°F | Resp 16 | Wt 185.0 lb

## 2019-07-06 DIAGNOSIS — N898 Other specified noninflammatory disorders of vagina: Secondary | ICD-10-CM | POA: Diagnosis not present

## 2019-07-06 DIAGNOSIS — R350 Frequency of micturition: Secondary | ICD-10-CM | POA: Diagnosis not present

## 2019-07-06 DIAGNOSIS — N760 Acute vaginitis: Secondary | ICD-10-CM | POA: Diagnosis not present

## 2019-07-06 DIAGNOSIS — N390 Urinary tract infection, site not specified: Secondary | ICD-10-CM | POA: Diagnosis not present

## 2019-07-06 LAB — POCT URINALYSIS DIPSTICK
Bilirubin, UA: NEGATIVE
Blood, UA: NEGATIVE
Glucose, UA: NEGATIVE
Ketones, UA: NEGATIVE
Leukocytes, UA: NEGATIVE
Nitrite, UA: NEGATIVE
Protein, UA: NEGATIVE
Urobilinogen, UA: NEGATIVE E.U./dL — AB
pH, UA: 5 (ref 5.0–8.0)

## 2019-07-06 NOTE — Progress Notes (Signed)
54 y.o. Married Caucasian female [id number] here with complaint of UTI, with onset  on 2 days ago. Patient complaining of urinary frequency/urgency/ and no pain with urination. Patient denies fever, chills, nausea or back pain. No new personal products. Patient feels not related to sexual activity. Denies any vaginal symptoms that she aware of..     Menopausal with vaginal dryness, not using anything for moisture.. Patient trying to consume adequate water intake. Denies excessive soda,tea or coffee use. No HSV symptoms. Uses HRT for menopausal symptoms.  Review of Systems  Constitutional: Negative.   HENT: Negative.   Eyes: Negative.   Respiratory: Negative.   Cardiovascular: Negative.   Gastrointestinal: Negative.   Genitourinary: Positive for urgency.       Vaginal itching, burning, urinary frequency  Musculoskeletal: Negative.   Skin: Negative.   Neurological: Negative.   Endo/Heme/Allergies: Negative.   Psychiatric/Behavioral: Negative.     O: Healthy female WDWN Affect: Normal, orientation x 3 Skin : warm and dry CVAT: negative bilateral Abdomen: soft, no masses, negative for suprapubic tenderness  Pelvic exam: External genital area: normal, no lesions Bladder,Urethra tender, Urethral meatus: tender, red Vagina: watery vaginal discharge noted, normal appearing vagina  Affirm taken Cervix: normal, non tender Uterus:normal,non tender Adnexa: normal non tender, no fullness or masses Rectal area: no lesions or redness   A:Urinary frequency Vaginitis    POCT urine- negative  P: Reviewed findings of negative urine and negative exam for UTI. Discussed vaginal irritation can also cause urinary frequency along with vaginal dryness, which patient has noted. Discussed Aveeno oatmeal sitz bath tid for symptom relief. Warning signs of UTI given and need to advise. Sleep in loose underwear to help with vaginal symptoms. Lab: Affirm, Urine culture Encouraged to limit soda, tea, and coffee  and be sure to increase water intake.  Rv prn

## 2019-07-06 NOTE — Patient Instructions (Signed)
Atrophic Vaginitis Atrophic vaginitis is a condition in which the tissues that line the vagina become dry and thin. This condition occurs in women who have stopped having their period. It is caused by a drop in a female hormone (estrogen). This hormone helps:  To keep the vagina moist.  To make a clear fluid. This clear fluid helps: ? To make the vagina ready for sex. ? To protect the vagina from infection. If the lining of the vagina is dry and thin, it may cause irritation, burning, or itchiness. It may also:  Make sex painful.  Make an exam of your vagina painful.  Cause bleeding.  Make you lose interest in sex.  Cause a burning feeling when you pee (urinate).  Cause a brown or yellow fluid to come from your vagina. Some women do not have symptoms. Follow these instructions at home: Medicines  Take over-the-counter and prescription medicines only as told by your doctor.  Do not use herbs or other medicines unless your doctor says it is okay.  Use medicines for for dryness. These include: ? Oils to make the vagina soft. ? Creams. ? Moisturizers. General instructions  Do not douche.  Do not use products that can make your vagina dry. These include: ? Scented sprays. ? Scented tampons. ? Scented soaps.  Sex can help increase blood flow and soften the tissue in the vagina. If it hurts to have sex: ? Tell your partner. ? Use products to make sex more comfortable. Use these only as told by your doctor. Contact a doctor if you:  Have discharge from the vagina that is different than usual.  Have a bad smell coming from your vagina.  Have new symptoms.  Do not get better.  Get worse. Summary  Atrophic vaginitis is a condition in which the lining of the vagina becomes dry and thin.  This condition affects women who have stopped having their periods.  Treatment may include using products that help make the vagina soft.  Call a doctor if do not get better with  treatment. This information is not intended to replace advice given to you by your health care provider. Make sure you discuss any questions you have with your health care provider. Document Revised: 04/22/2017 Document Reviewed: 04/22/2017 Elsevier Patient Education  [place]. Urinary Frequency, Adult Urinary frequency means urinating more often than usual. You may urinate every 1-2 hours even though you drink a normal amount of fluid and do not have a bladder infection or condition. Although you urinate more often than normal, the total amount of urine produced in a day is normal. With urinary frequency, you may have an urgent need to urinate often. The stress and anxiety of needing to find a bathroom quickly can make this urge worse. This condition may go away on its own or you may need treatment at home. Home treatment may include bladder training, exercises, taking medicines, or making changes to your diet. Follow these instructions at home: Bladder health   Keep a bladder diary if told by your health care provider. Keep track of: ? What you eat and drink. ? How often you urinate. ? How much you urinate.  Follow a bladder training program if told by your health care provider. This may include: ? Learning to delay going to the bathroom. ? Double urinating (voiding). This helps if you are not completely emptying your bladder. ? Scheduled voiding.  Do Kegel exercises as told by your health care provider. Kegel exercises  strengthen the muscles that help control urination, which may help the condition. Eating and drinking  If told by your health care provider, make diet changes, such as: ? Avoiding caffeine. ? Drinking fewer fluids, especially alcohol. ? Not drinking in the evening. ? Avoiding foods or drinks that may irritate the bladder. These include coffee, tea, soda, artificial sweeteners, citrus, tomato-based foods, and chocolate. ? Eating foods that help prevent or ease  constipation. Constipation can make this condition worse. Your health care provider may recommend that you:  Drink enough fluid to keep your urine pale yellow.  Take over-the-counter or prescription medicines.  Eat foods that are high in fiber, such as beans, whole grains, and fresh fruits and vegetables.  Limit foods that are high in fat and processed sugars, such as fried or sweet foods. General instructions  Take over-the-counter and prescription medicines only as told by your health care provider.  Keep all follow-up visits as told by your health care provider. This is important. Contact a health care provider if:  You start urinating more often.  You feel pain or irritation when you urinate.  You notice blood in your urine.  Your urine looks cloudy.  You develop a fever.  You begin vomiting. Get help right away if:  You are unable to urinate. Summary  Urinary frequency means urinating more often than usual. With urinary frequency, you may urinate every 1-2 hours even though you drink a normal amount of fluid and do not have a bladder infection or other bladder condition.  Your health care provider may recommend that you keep a bladder diary, follow a bladder training program, or make dietary changes.  If told by your health care provider, do Kegel exercises to strengthen the muscles that help control urination.  Take over-the-counter and prescription medicines only as told by your health care provider.  Contact a health care provider if your symptoms do not improve or get worse. This information is not intended to replace advice given to you by your health care provider. Make sure you discuss any questions you have with your health care provider. Document Revised: 10/17/2017 Document Reviewed: 10/17/2017 Elsevier Patient Education  [place].

## 2019-07-07 LAB — VAGINITIS/VAGINOSIS, DNA PROBE
Candida Species: NEGATIVE
Gardnerella vaginalis: NEGATIVE
Trichomonas vaginosis: NEGATIVE

## 2019-07-08 LAB — URINE CULTURE

## 2019-07-13 ENCOUNTER — Encounter: Payer: Self-pay | Admitting: Certified Nurse Midwife

## 2019-07-25 ENCOUNTER — Ambulatory Visit: Payer: Self-pay | Attending: Internal Medicine

## 2019-07-25 DIAGNOSIS — Z23 Encounter for immunization: Secondary | ICD-10-CM

## 2019-07-25 NOTE — Progress Notes (Signed)
   Covid-19 Vaccination Clinic  Name:  Andrea Thomas    MRN: [id number] DOB: [birth year]  07/25/2019  [name] was observed post Covid-19 immunization for 15 minutes without incident. She was provided with Vaccine Information Sheet and instruction to access the V-Safe system.   [name] was instructed to call 911 with any severe reactions post vaccine: Marland Kitchen Difficulty breathing  . Swelling of face and throat  . A fast heartbeat  . A bad rash all over body  . Dizziness and weakness   Immunizations Administered    Name Date Dose VIS Date Route   Pfizer COVID-19 Vaccine 07/25/2019 10:55 AM 0.3 mL 04/03/2019 Intramuscular   Manufacturer: [place]   Lot: [id number]   [place]: [id number]

## 2019-08-19 ENCOUNTER — Ambulatory Visit: Payer: Self-pay | Attending: Internal Medicine

## 2019-08-19 DIAGNOSIS — Z23 Encounter for immunization: Secondary | ICD-10-CM

## 2019-08-19 NOTE — Progress Notes (Signed)
   Covid-19 Vaccination Clinic  Name:  Andrea Thomas    MRN: [id number] DOB: April 23, 1966  08/19/2019  Andrea Thomas was observed post Covid-19 immunization for 30 minutes based on pre-vaccination screening without incident. She was provided with Vaccine Information Sheet and instruction to access the V-Safe system.   Andrea Thomas was instructed to call 911 with any severe reactions post vaccine: Marland Kitchen Difficulty breathing  . Swelling of face and throat  . A fast heartbeat  . A bad rash all over body  . Dizziness and weakness   Immunizations Administered    Name Date Dose VIS Date Route   Pfizer COVID-19 Vaccine 08/19/2019  2:39 PM 0.3 mL 06/17/2018 Intramuscular   Manufacturer: [place]   Lot: [id number]   [place]: [id number]

## 2019-09-22 ENCOUNTER — Other Ambulatory Visit: Payer: Self-pay | Admitting: Obstetrics and Gynecology

## 2019-09-22 NOTE — Telephone Encounter (Signed)
Patient left a message on the answering machines asking for refills of [facility] [address].

## 2019-09-22 NOTE — Telephone Encounter (Signed)
Medication refill request: Vivelle-dot  Last AEX:  11-17-2018 BS  Next AEX: 12-23-19  Last MMG (if hormonal medication request): 01-28-2019 density C/BIRADS 1 negative  Refill authorized: Today, please advise.   Medication pended for #24, 0RF. Please refill if appropriate.

## 2019-09-23 MED ORDER — ESTRADIOL 0.1 MG/24HR TD PTTW: 1.0000 | MEDICATED_PATCH | TRANSDERMAL | 0 refills | Status: DC

## 2019-11-03 ENCOUNTER — Other Ambulatory Visit: Payer: Self-pay | Admitting: Obstetrics and Gynecology

## 2019-11-06 DIAGNOSIS — F331 Major depressive disorder, recurrent, moderate: Secondary | ICD-10-CM | POA: Diagnosis not present

## 2019-11-06 DIAGNOSIS — G4700 Insomnia, unspecified: Secondary | ICD-10-CM | POA: Diagnosis not present

## 2019-11-06 DIAGNOSIS — F411 Generalized anxiety disorder: Secondary | ICD-10-CM | POA: Diagnosis not present

## 2019-11-26 ENCOUNTER — Other Ambulatory Visit: Payer: Self-pay

## 2019-11-26 DIAGNOSIS — Z79890 Hormone replacement therapy (postmenopausal): Secondary | ICD-10-CM

## 2019-11-26 MED ORDER — PROGESTERONE MICRONIZED 100 MG PO CAPS: 100.0000 mg | ORAL_CAPSULE | Freq: Every day | ORAL | 0 refills | Status: DC

## 2019-11-26 NOTE — Telephone Encounter (Signed)
Medication refill request: Prostererone 100mg  Last AEX:  11/17/18 Next AEX: 12/23/19 Last MMG (if hormonal medication request): 01/28/19  Normal  Refill authorized: 60/0

## 2019-12-06 ENCOUNTER — Other Ambulatory Visit: Payer: Self-pay | Admitting: Obstetrics and Gynecology

## 2019-12-11 ENCOUNTER — Other Ambulatory Visit: Payer: Self-pay | Admitting: Obstetrics and Gynecology

## 2019-12-11 DIAGNOSIS — Z79890 Hormone replacement therapy (postmenopausal): Secondary | ICD-10-CM

## 2019-12-11 NOTE — Telephone Encounter (Signed)
Med refill request: Prometrium 100 mg capsule and Vivelle -Dot 0.1 mg patch twice wkly Last AEX: 11/17/18 w/ Dr.Silva Next AEX: 12/23/19 w/ Dr. Quincy Simmonds Last MMG: 01/28/19 negative   Rx pended for Prometrium 100 mg capsule #30/0RF, Vivelle-dot patch #8/0RF  Routing to Dr. Quincy Simmonds

## 2019-12-12 MED ORDER — ESTRADIOL 0.1 MG/24HR TD PTTW
1.0000 | MEDICATED_PATCH | TRANSDERMAL | 0 refills | Status: DC
Start: 2019-12-14 — End: 2019-12-23

## 2019-12-12 MED ORDER — PROGESTERONE MICRONIZED 100 MG PO CAPS: 100.0000 mg | ORAL_CAPSULE | Freq: Every day | ORAL | 0 refills | Status: DC

## 2019-12-21 DIAGNOSIS — Z1159 Encounter for screening for other viral diseases: Secondary | ICD-10-CM | POA: Diagnosis not present

## 2019-12-21 DIAGNOSIS — Z0000 Encounter for general adult medical examination without abnormal findings: Secondary | ICD-10-CM | POA: Diagnosis not present

## 2019-12-22 NOTE — Progress Notes (Signed)
54 y.o. G1P0002 Married Caucasian female here for annual exam.    No more hot flashes on her HRT.  She wants to continue with this.   Went to [place] in August.   Received her Covid vaccine.    Has appt with PCP tomorrow.  PCP:  Rachell Cipro, MD   Patient's last menstrual period was 12/18/2014.           Sexually active: Yes.    The current method of family planning is post menopausal status.    Exercising: Yes.    walking, weights and yoga Smoker:  no   Health Maintenance: Pap:  11/15/17 Neg:Neg HR HPV, 10-13-12 Neg, 10-10-11 Neg:Neg HR HPV History of abnormal Pap:  no MMG: 01-28-19 3D/Neg/density C/BiRads1 Colonoscopy: 01-21-19 normal;next 10 years BMD:   n/a  Result  n/a TDaP: 2017 Gardasil:   no HIV:Neg in the past Hep C:never Screening Labs:  Yesterday through PCP.    reports that she has never smoked. She has never used smokeless tobacco. She reports current alcohol use of about 1.0 - 2.0 standard drink of alcohol per week. She reports that she does not use drugs.  Past Medical History:  Diagnosis Date  . Arthritis   . 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 History of panic attacks   . HSV-1 infection     Past Surgical History:  Procedure Laterality Date  . BASAL CELL CARCINOMA EXCISION    . CESAREAN SECTION    . HERNIA REPAIR     54 years old    Current Outpatient Medications  Medication Sig Dispense Refill  . [START ON 12/24/2019] estradiol (VIVELLE-DOT) 0.1 MG/24HR patch Place 1 patch (0.1 mg total) onto the skin 2 (two) times a week. 24 patch 3  . Multiple Vitamin (MULTIVITAMIN WITH MINERALS) TABS tablet Take 1 tablet by mouth daily.    . progesterone (PROMETRIUM) 100 MG capsule Take 1 capsule (100 mg total) by mouth daily. 90 capsule 3  . sertraline (ZOLOFT) 50 MG tablet Take 50 mg by mouth daily.  0  . valACYclovir (VALTREX) 1000 MG tablet      No current facility-administered medications for this visit.    Family History  Problem Relation Age of Onset  .  Cancer Father        lymphoma  . Heart disease Father   . Kidney failure Father   . Stroke Father   . Skin cancer Maternal Uncle     Review of Systems  All other systems reviewed and are negative.   Exam:   BP 138/80 (Cuff Size: Large)   Pulse 76   Resp 18   Ht 5\' 3"  (1.6 m)   Wt 182 lb (82.6 kg)   LMP 12/18/2014 Comment: Pregnancy Test Waiver signed 08/01/16  BMI 32.24 kg/m     General appearance: alert, cooperative and appears stated age Head: normocephalic, without obvious abnormality, atraumatic Neck: no adenopathy, supple, symmetrical, trachea midline and thyroid normal to inspection and palpation Lungs: clear to auscultation bilaterally Breasts: normal appearance, no masses or tenderness, No nipple retraction or dimpling, No nipple discharge or bleeding, No axillary adenopathy Heart: regular rate and rhythm Abdomen: soft, non-tender; no masses, no organomegaly Extremities: extremities normal, atraumatic, no cyanosis or edema Skin: skin color, texture, turgor normal. No rashes or lesions Lymph nodes: cervical, supraclavicular, and axillary nodes normal. Neurologic: grossly normal  Pelvic: External genitalia:  no lesions              No abnormal inguinal  nodes palpated.              Urethra:  normal appearing urethra with no masses, tenderness or lesions              Bartholins and Skenes: normal                 Vagina: normal appearing vagina with normal color and discharge, no lesions              Cervix: no lesions              Pap taken: No. Bimanual Exam:  Uterus:  normal size, contour, position, consistency, mobility, non-tender              Adnexa: no mass, fullness, tenderness              Rectal exam: Yes.  .  Confirms.              Anus:  normal sphincter tone, no lesions  Chaperone was present for exam.  Assessment:   Well woman visit with normal exam. HRT.  Hx HSV 1.  Uses Valtrex. PCP prescribing.  Hx panic attacks.   On Zoloft.  PCP prescribing.    Plan: Mammogram screening discussed. Self breast awareness reviewed. Pap and HR HPV 2024.  Guidelines for Calcium, Vitamin D, regular exercise program including cardiovascular and weight bearing exercise. Refill of Prometrium and Vivelle Dot for one year.   Discused WHI and use of HRT which can increase risk of PE, DVT, MI, stroke and breast cancer.  Follow up annually and prn.   After visit summary provided.

## 2019-12-23 ENCOUNTER — Other Ambulatory Visit: Payer: Self-pay

## 2019-12-23 ENCOUNTER — Encounter: Payer: Self-pay | Admitting: Obstetrics and Gynecology

## 2019-12-23 ENCOUNTER — Ambulatory Visit (INDEPENDENT_AMBULATORY_CARE_PROVIDER_SITE_OTHER): Payer: BC Managed Care – PPO | Admitting: Obstetrics and Gynecology

## 2019-12-23 VITALS — BP 138/80 | HR 76 | Resp 18 | Ht 63.0 in | Wt 182.0 lb

## 2019-12-23 DIAGNOSIS — Z79890 Hormone replacement therapy (postmenopausal): Secondary | ICD-10-CM | POA: Diagnosis not present

## 2019-12-23 DIAGNOSIS — Z01419 Encounter for gynecological examination (general) (routine) without abnormal findings: Secondary | ICD-10-CM

## 2019-12-23 MED ORDER — PROGESTERONE MICRONIZED 100 MG PO CAPS: 100.0000 mg | ORAL_CAPSULE | Freq: Every day | ORAL | 3 refills | Status: DC

## 2019-12-23 MED ORDER — ESTRADIOL 0.1 MG/24HR TD PTTW
1.0000 | MEDICATED_PATCH | TRANSDERMAL | 3 refills | Status: DC
Start: 2019-12-24 — End: 2021-01-11

## 2019-12-23 NOTE — Patient Instructions (Signed)

## 2019-12-24 DIAGNOSIS — Z23 Encounter for immunization: Secondary | ICD-10-CM | POA: Diagnosis not present

## 2019-12-24 DIAGNOSIS — E782 Mixed hyperlipidemia: Secondary | ICD-10-CM | POA: Diagnosis not present

## 2019-12-24 DIAGNOSIS — Z0000 Encounter for general adult medical examination without abnormal findings: Secondary | ICD-10-CM | POA: Diagnosis not present

## 2020-01-04 DIAGNOSIS — J309 Allergic rhinitis, unspecified: Secondary | ICD-10-CM | POA: Diagnosis not present

## 2020-01-04 DIAGNOSIS — J029 Acute pharyngitis, unspecified: Secondary | ICD-10-CM | POA: Diagnosis not present

## 2020-01-04 DIAGNOSIS — Z20822 Contact with and (suspected) exposure to covid-19: Secondary | ICD-10-CM | POA: Diagnosis not present

## 2020-01-08 DIAGNOSIS — D225 Melanocytic nevi of trunk: Secondary | ICD-10-CM | POA: Diagnosis not present

## 2020-01-08 DIAGNOSIS — L578 Other skin changes due to chronic exposure to nonionizing radiation: Secondary | ICD-10-CM | POA: Diagnosis not present

## 2020-01-08 DIAGNOSIS — L309 Dermatitis, unspecified: Secondary | ICD-10-CM | POA: Diagnosis not present

## 2020-01-08 DIAGNOSIS — L821 Other seborrheic keratosis: Secondary | ICD-10-CM | POA: Diagnosis not present

## 2020-02-29 ENCOUNTER — Other Ambulatory Visit: Payer: Self-pay | Admitting: Obstetrics and Gynecology

## 2020-02-29 DIAGNOSIS — Z1231 Encounter for screening mammogram for malignant neoplasm of breast: Secondary | ICD-10-CM

## 2020-03-01 DIAGNOSIS — M9904 Segmental and somatic dysfunction of sacral region: Secondary | ICD-10-CM | POA: Diagnosis not present

## 2020-03-01 DIAGNOSIS — M25551 Pain in right hip: Secondary | ICD-10-CM | POA: Diagnosis not present

## 2020-03-01 DIAGNOSIS — M9903 Segmental and somatic dysfunction of lumbar region: Secondary | ICD-10-CM | POA: Diagnosis not present

## 2020-03-01 DIAGNOSIS — Z23 Encounter for immunization: Secondary | ICD-10-CM | POA: Diagnosis not present

## 2020-03-01 DIAGNOSIS — M9905 Segmental and somatic dysfunction of pelvic region: Secondary | ICD-10-CM | POA: Diagnosis not present

## 2020-03-02 ENCOUNTER — Inpatient Hospital Stay: Admission: RE | Admit: 2020-03-02 | Payer: Self-pay | Source: Ambulatory Visit

## 2020-03-02 DIAGNOSIS — Z1231 Encounter for screening mammogram for malignant neoplasm of breast: Secondary | ICD-10-CM

## 2020-03-09 DIAGNOSIS — L570 Actinic keratosis: Secondary | ICD-10-CM | POA: Diagnosis not present

## 2020-03-14 ENCOUNTER — Other Ambulatory Visit: Payer: Self-pay | Admitting: Family Medicine

## 2020-03-14 DIAGNOSIS — Z1231 Encounter for screening mammogram for malignant neoplasm of breast: Secondary | ICD-10-CM

## 2020-03-15 ENCOUNTER — Other Ambulatory Visit: Payer: Self-pay | Admitting: Obstetrics and Gynecology

## 2020-03-15 ENCOUNTER — Ambulatory Visit
Admission: RE | Admit: 2020-03-15 | Discharge: 2020-03-15 | Disposition: A | Discharge status: Home / Self Care | Payer: BC Managed Care – PPO | Source: Ambulatory Visit

## 2020-03-15 ENCOUNTER — Other Ambulatory Visit: Payer: Self-pay

## 2020-03-15 DIAGNOSIS — Z1231 Encounter for screening mammogram for malignant neoplasm of breast: Secondary | ICD-10-CM

## 2020-03-30 ENCOUNTER — Encounter: Payer: Self-pay | Admitting: Obstetrics and Gynecology

## 2020-04-05 ENCOUNTER — Telehealth: Payer: Self-pay

## 2020-04-05 ENCOUNTER — Encounter: Payer: Self-pay | Admitting: Obstetrics and Gynecology

## 2020-04-05 NOTE — Telephone Encounter (Signed)
Tried calling patient regarding refills. No answer, left message for patient to call our office back and may speak with Karmen Bongo, RN as I will be out of the office 12/15. Refill for Progesterone sent to pharmacy 12/23/19 #90 w/3 refills and Estradiol 12/24/19 #24 w/3 refills.

## 2020-04-05 NOTE — Telephone Encounter (Signed)
Pianalto-Clampett, Aletha Halim [facility] Phone Number: [phone]   [id number],  Just following up on note regarding getting [facility] [address].  That you,  Stanton Kidney

## 2020-04-08 NOTE — Telephone Encounter (Signed)
Tried calling patient, no answer. Left message for patient to call me back. 

## 2020-04-18 NOTE — Telephone Encounter (Signed)
Patient is returning call.  °

## 2020-04-18 NOTE — Telephone Encounter (Signed)
Patient aware I called her pharmacy & they said they do have her refills & they will get them ready for her.

## 2020-05-03 DIAGNOSIS — J069 Acute upper respiratory infection, unspecified: Secondary | ICD-10-CM | POA: Diagnosis not present

## 2020-05-04 ENCOUNTER — Other Ambulatory Visit: Payer: BC Managed Care – PPO

## 2020-07-07 DIAGNOSIS — L300 Nummular dermatitis: Secondary | ICD-10-CM | POA: Diagnosis not present

## 2020-07-07 DIAGNOSIS — L578 Other skin changes due to chronic exposure to nonionizing radiation: Secondary | ICD-10-CM | POA: Diagnosis not present

## 2020-07-07 DIAGNOSIS — L570 Actinic keratosis: Secondary | ICD-10-CM | POA: Diagnosis not present

## 2020-07-07 DIAGNOSIS — D225 Melanocytic nevi of trunk: Secondary | ICD-10-CM | POA: Diagnosis not present

## 2020-07-07 DIAGNOSIS — L821 Other seborrheic keratosis: Secondary | ICD-10-CM | POA: Diagnosis not present

## 2020-08-15 DIAGNOSIS — U071 COVID-19: Secondary | ICD-10-CM | POA: Diagnosis not present

## 2020-11-09 DIAGNOSIS — F331 Major depressive disorder, recurrent, moderate: Secondary | ICD-10-CM | POA: Diagnosis not present

## 2020-11-09 DIAGNOSIS — N951 Menopausal and female climacteric states: Secondary | ICD-10-CM | POA: Diagnosis not present

## 2020-11-09 DIAGNOSIS — F411 Generalized anxiety disorder: Secondary | ICD-10-CM | POA: Diagnosis not present

## 2020-12-28 DIAGNOSIS — Z0000 Encounter for general adult medical examination without abnormal findings: Secondary | ICD-10-CM | POA: Diagnosis not present

## 2020-12-28 DIAGNOSIS — Z23 Encounter for immunization: Secondary | ICD-10-CM | POA: Diagnosis not present

## 2021-01-10 NOTE — Progress Notes (Signed)
55 y.o. G23P0002 Married Caucasian female here for annual exam.    On HRT.  No hot flashes.  Wants to continue treatment.   Had last Covid booster in July.   Just had labs done with PCP.  She will see dermatology next week.   PCP: Rachell Cipro, MD  Patient's last menstrual period was 12/18/2014.           Sexually active: Yes.    The current method of family planning is post menopausal status.    Exercising: Yes.     Walking and yoga Smoker:  no  Health Maintenance: Pap:  11/15/17 Neg:Neg HR HPV, 10-13-12 Neg, 10-10-11 Neg:Neg HR HPV History of abnormal Pap:  no MMG: 03-15-20 3D/Neg/BiRads1 Colonoscopy:  01-21-19 normal;next 10 years BMD:  n/a  Result  n/a TDaP:  2017 Gardasil:   no HIV: Neg in the past Hep C: no Screening Labs:  PCP. Flu vaccine:  completed.   reports that she has never smoked. She has never used smokeless tobacco. She reports current alcohol use of about 1.0 - 2.0 standard drink per week. She reports that she does not use drugs.  Past Medical History:  Diagnosis Date   Arthritis    Cancer ([place]) Basil Cell   History of panic attacks    HSV-1 infection     Past Surgical History:  Procedure Laterality Date   BASAL CELL CARCINOMA EXCISION     CESAREAN SECTION     HERNIA REPAIR     55 years old    Current Outpatient Medications  Medication Sig Dispense Refill   estradiol (VIVELLE-DOT) 0.075 MG/24HR Place 1 patch onto the skin 2 (two) times a week.     Multiple Vitamin (MULTIVITAMIN WITH MINERALS) TABS tablet Take 1 tablet by mouth daily.     progesterone (PROMETRIUM) 100 MG capsule Take 1 capsule (100 mg total) by mouth daily. 90 capsule 3   sertraline (ZOLOFT) 50 MG tablet Take 50 mg by mouth daily.  0   valACYclovir (VALTREX) 1000 MG tablet      No current facility-administered medications for this visit.    Family History  Problem Relation Age of Onset   Cancer Father        lymphoma   Heart disease Father    Kidney failure Father     Stroke Father    Skin cancer Maternal Uncle     Review of Systems  All other systems reviewed and are negative.  Exam:   BP 122/70   Pulse 77   Ht 5\' 2"  (1.575 m)   Wt 180 lb (81.6 kg)   LMP 12/18/2014 Comment: Pregnancy Test Waiver signed 08/01/16  SpO2 98%   BMI 32.92 kg/m     General appearance: alert, cooperative and appears stated age Head: normocephalic, without obvious abnormality, atraumatic Neck: no adenopathy, supple, symmetrical, trachea midline and thyroid normal to inspection and palpation Lungs: clear to auscultation bilaterally Breasts: normal appearance, no masses or tenderness, No nipple retraction or dimpling, No nipple discharge or bleeding, No axillary adenopathy Heart: regular rate and rhythm Abdomen: soft, non-tender; no masses, no organomegaly Extremities: extremities normal, atraumatic, no cyanosis or edema Skin: skin color, texture, turgor normal. No rashes or lesions Lymph nodes: cervical, supraclavicular, and axillary nodes normal. Neurologic: grossly normal  Pelvic: External genitalia:  no lesions              No abnormal inguinal nodes palpated.  Urethra:  normal appearing urethra with no masses, tenderness or lesions              Bartholins and Skenes: normal                 Vagina: normal appearing vagina with normal color and discharge, no lesions              Cervix: no lesions              Pap taken: no Bimanual Exam:  Uterus:  normal size, contour, position, consistency, mobility, non-tender.  Involuntary abdominal wall contraction during exam.               Adnexa: no mass, fullness, tenderness              Rectal exam: yes.  Confirms.              Anus:  normal sphincter tone, no lesions  Chaperone was present for exam:  Estill Bamberg, CMA.  Assessment:   Well woman visit with gynecologic exam. HRT.  Hx HSV 1.  Uses Valtrex. Dermatology prescribing.  Hx panic attacks.   On Zoloft.  PCP prescribing.   Plan: Mammogram screening  discussed. Self breast awareness reviewed. Pap and HR HPV 2024. Guidelines for Calcium, Vitamin D, regular exercise program including cardiovascular and weight bearing exercise. Refill of Vivelle Dot 0.075 mg twice weekly and Prometrium 100 mg nightly, 90 day refills for one year.  Discused WHI and use of HRT which can increase risk of PE, DVT, MI, stroke and breast cancer.  Follow up annually and prn.    After visit summary provided.

## 2021-01-11 ENCOUNTER — Ambulatory Visit (INDEPENDENT_AMBULATORY_CARE_PROVIDER_SITE_OTHER): Payer: BC Managed Care – PPO | Admitting: Obstetrics and Gynecology

## 2021-01-11 ENCOUNTER — Encounter: Payer: Self-pay | Admitting: Obstetrics and Gynecology

## 2021-01-11 ENCOUNTER — Other Ambulatory Visit: Payer: Self-pay

## 2021-01-11 DIAGNOSIS — Z01419 Encounter for gynecological examination (general) (routine) without abnormal findings: Secondary | ICD-10-CM | POA: Diagnosis not present

## 2021-01-11 DIAGNOSIS — Z79890 Hormone replacement therapy (postmenopausal): Secondary | ICD-10-CM

## 2021-01-11 MED ORDER — ESTRADIOL 0.075 MG/24HR TD PTTW
1.0000 | MEDICATED_PATCH | TRANSDERMAL | 3 refills | Status: DC
Start: 2021-01-12 — End: 2022-01-22

## 2021-01-11 MED ORDER — PROGESTERONE MICRONIZED 100 MG PO CAPS: 100.0000 mg | ORAL_CAPSULE | Freq: Every day | ORAL | 3 refills | Status: DC

## 2021-01-11 NOTE — Patient Instructions (Signed)

## 2021-01-19 DIAGNOSIS — Z86018 Personal history of other benign neoplasm: Secondary | ICD-10-CM | POA: Diagnosis not present

## 2021-01-19 DIAGNOSIS — D225 Melanocytic nevi of trunk: Secondary | ICD-10-CM | POA: Diagnosis not present

## 2021-01-19 DIAGNOSIS — D2272 Melanocytic nevi of left lower limb, including hip: Secondary | ICD-10-CM | POA: Diagnosis not present

## 2021-01-19 DIAGNOSIS — D2271 Melanocytic nevi of right lower limb, including hip: Secondary | ICD-10-CM | POA: Diagnosis not present

## 2021-01-26 DIAGNOSIS — H02834 Dermatochalasis of left upper eyelid: Secondary | ICD-10-CM | POA: Diagnosis not present

## 2021-01-26 DIAGNOSIS — H02413 Mechanical ptosis of bilateral eyelids: Secondary | ICD-10-CM | POA: Diagnosis not present

## 2021-01-26 DIAGNOSIS — H02423 Myogenic ptosis of bilateral eyelids: Secondary | ICD-10-CM | POA: Diagnosis not present

## 2021-01-26 DIAGNOSIS — H0279 Other degenerative disorders of eyelid and periocular area: Secondary | ICD-10-CM | POA: Diagnosis not present

## 2021-01-26 DIAGNOSIS — H02831 Dermatochalasis of right upper eyelid: Secondary | ICD-10-CM | POA: Diagnosis not present

## 2021-02-07 DIAGNOSIS — H53483 Generalized contraction of visual field, bilateral: Secondary | ICD-10-CM | POA: Diagnosis not present

## 2021-04-20 DIAGNOSIS — H02421 Myogenic ptosis of right eyelid: Secondary | ICD-10-CM | POA: Diagnosis not present

## 2021-05-29 ENCOUNTER — Other Ambulatory Visit: Payer: Self-pay | Admitting: Obstetrics and Gynecology

## 2021-05-29 DIAGNOSIS — Z1231 Encounter for screening mammogram for malignant neoplasm of breast: Secondary | ICD-10-CM

## 2021-06-01 ENCOUNTER — Ambulatory Visit: Payer: BC Managed Care – PPO

## 2021-06-16 DIAGNOSIS — G8929 Other chronic pain: Secondary | ICD-10-CM | POA: Diagnosis not present

## 2021-06-16 DIAGNOSIS — M76891 Other specified enthesopathies of right lower limb, excluding foot: Secondary | ICD-10-CM | POA: Diagnosis not present

## 2021-06-16 DIAGNOSIS — M25551 Pain in right hip: Secondary | ICD-10-CM | POA: Diagnosis not present

## 2021-06-19 ENCOUNTER — Ambulatory Visit
Admission: RE | Admit: 2021-06-19 | Discharge: 2021-06-19 | Disposition: A | Discharge status: Home / Self Care | Payer: BC Managed Care – PPO | Source: Ambulatory Visit | Attending: Obstetrics and Gynecology | Admitting: Obstetrics and Gynecology

## 2021-06-19 DIAGNOSIS — Z1231 Encounter for screening mammogram for malignant neoplasm of breast: Secondary | ICD-10-CM | POA: Diagnosis not present

## 2021-06-23 ENCOUNTER — Ambulatory Visit: Payer: Self-pay

## 2021-06-30 DIAGNOSIS — J209 Acute bronchitis, unspecified: Secondary | ICD-10-CM | POA: Diagnosis not present

## 2021-07-13 DIAGNOSIS — M76891 Other specified enthesopathies of right lower limb, excluding foot: Secondary | ICD-10-CM | POA: Diagnosis not present

## 2021-07-19 DIAGNOSIS — L723 Sebaceous cyst: Secondary | ICD-10-CM | POA: Diagnosis not present

## 2021-07-19 DIAGNOSIS — L570 Actinic keratosis: Secondary | ICD-10-CM | POA: Diagnosis not present

## 2021-07-19 DIAGNOSIS — L309 Dermatitis, unspecified: Secondary | ICD-10-CM | POA: Diagnosis not present

## 2021-07-19 DIAGNOSIS — L578 Other skin changes due to chronic exposure to nonionizing radiation: Secondary | ICD-10-CM | POA: Diagnosis not present

## 2021-07-19 DIAGNOSIS — L821 Other seborrheic keratosis: Secondary | ICD-10-CM | POA: Diagnosis not present

## 2021-07-21 DIAGNOSIS — M76891 Other specified enthesopathies of right lower limb, excluding foot: Secondary | ICD-10-CM | POA: Diagnosis not present

## 2021-08-09 DIAGNOSIS — S73199A Other sprain of unspecified hip, initial encounter: Secondary | ICD-10-CM | POA: Diagnosis not present

## 2021-08-09 DIAGNOSIS — G4700 Insomnia, unspecified: Secondary | ICD-10-CM | POA: Diagnosis not present

## 2021-08-09 DIAGNOSIS — F411 Generalized anxiety disorder: Secondary | ICD-10-CM | POA: Diagnosis not present

## 2021-08-09 DIAGNOSIS — F331 Major depressive disorder, recurrent, moderate: Secondary | ICD-10-CM | POA: Diagnosis not present

## 2021-08-29 DIAGNOSIS — L989 Disorder of the skin and subcutaneous tissue, unspecified: Secondary | ICD-10-CM | POA: Diagnosis not present

## 2021-08-29 DIAGNOSIS — L720 Epidermal cyst: Secondary | ICD-10-CM | POA: Diagnosis not present

## 2021-08-29 DIAGNOSIS — L723 Sebaceous cyst: Secondary | ICD-10-CM | POA: Diagnosis not present

## 2021-09-10 IMAGING — MG DIGITAL SCREENING BILAT W/ TOMO W/ CAD
8 series · 8 of 24 positions shown · non-contrast
Comparison: Previous exam(s).

CLINICAL DATA: Screening.

EXAM:
DIGITAL SCREENING BILATERAL MAMMOGRAM WITH TOMO AND CAD

[L MLO synth-2D]
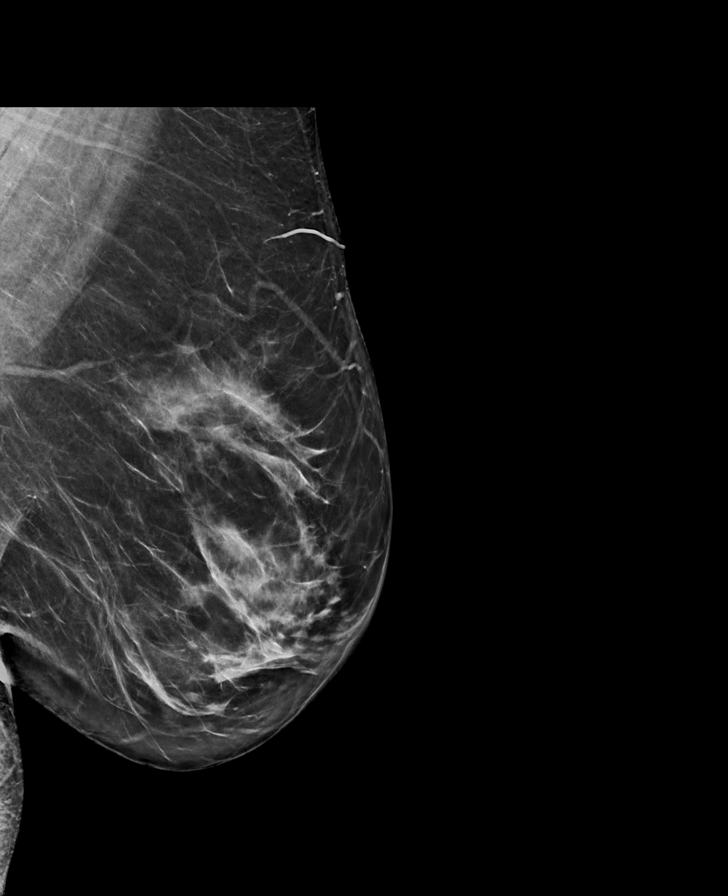

[R MLO synth-2D]
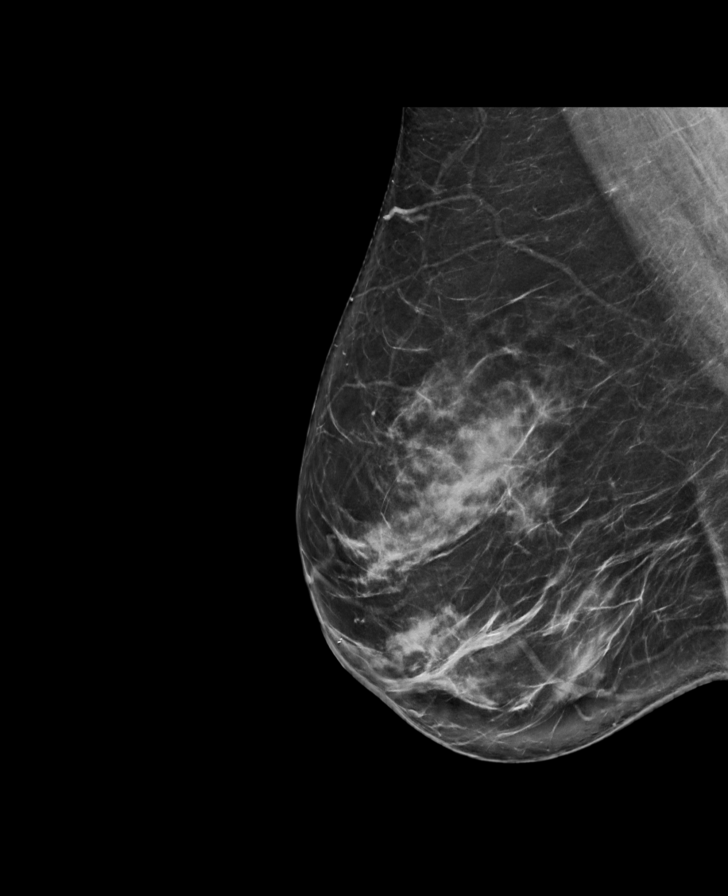

[L CC synth-2D]
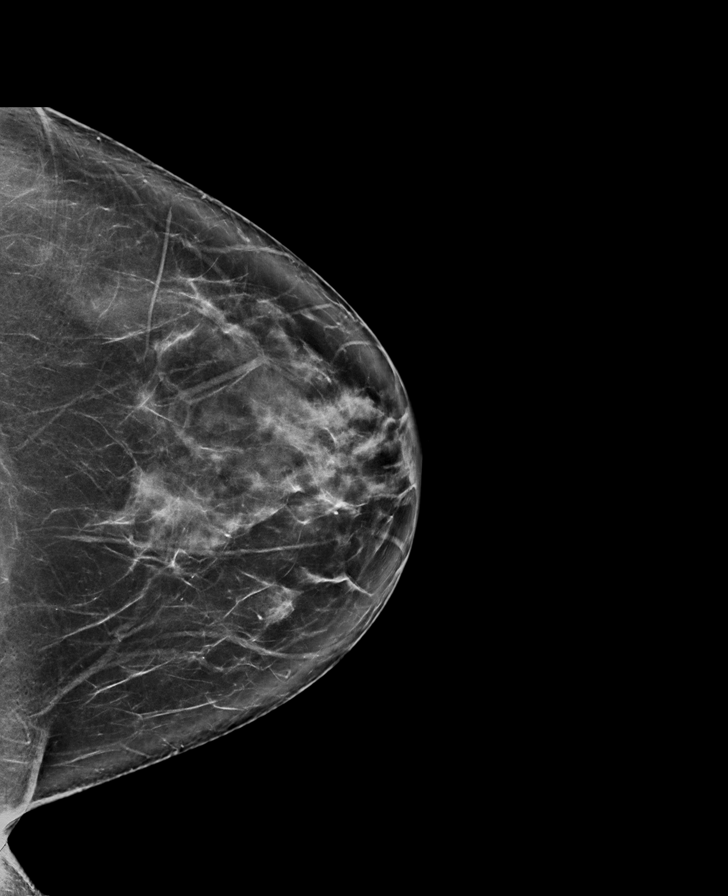

[R CC synth-2D]
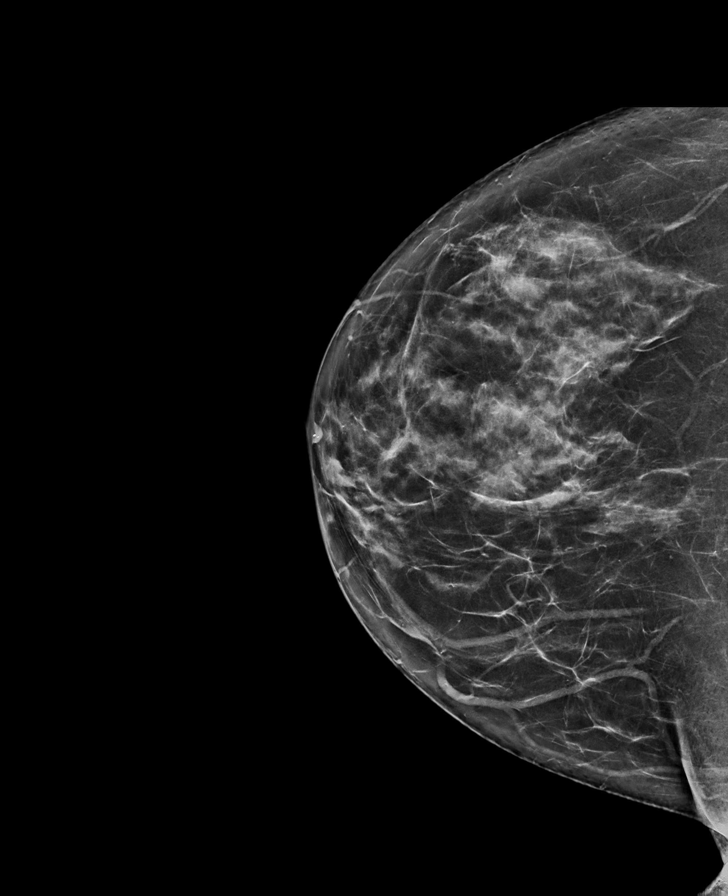

[L MLO tomo · tomo slice 45/88.0]
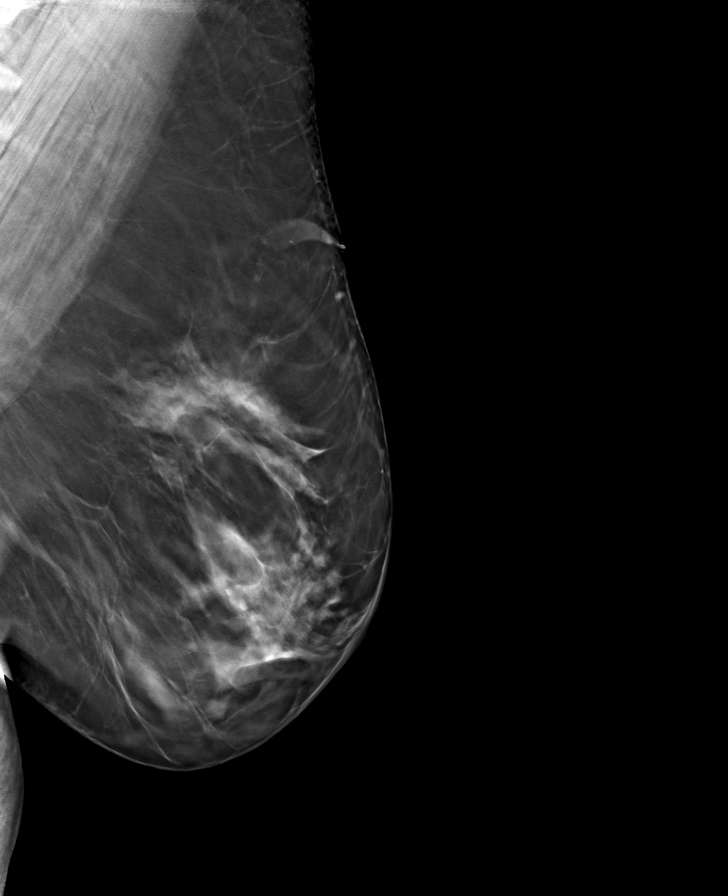

[L CC tomo · tomo slice 44/87.0]
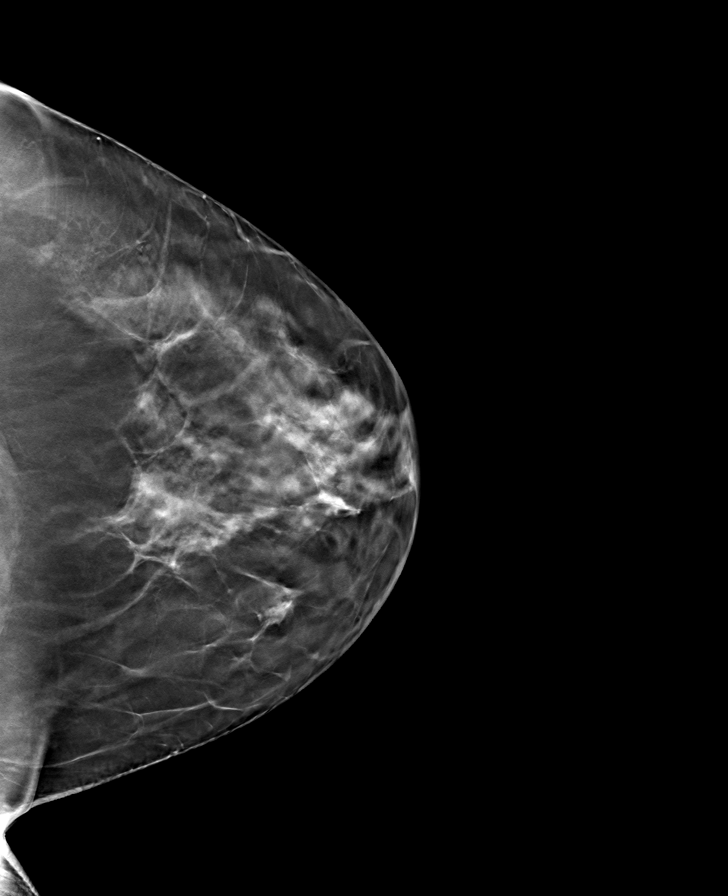

[R CC tomo · tomo slice 37/74.0]
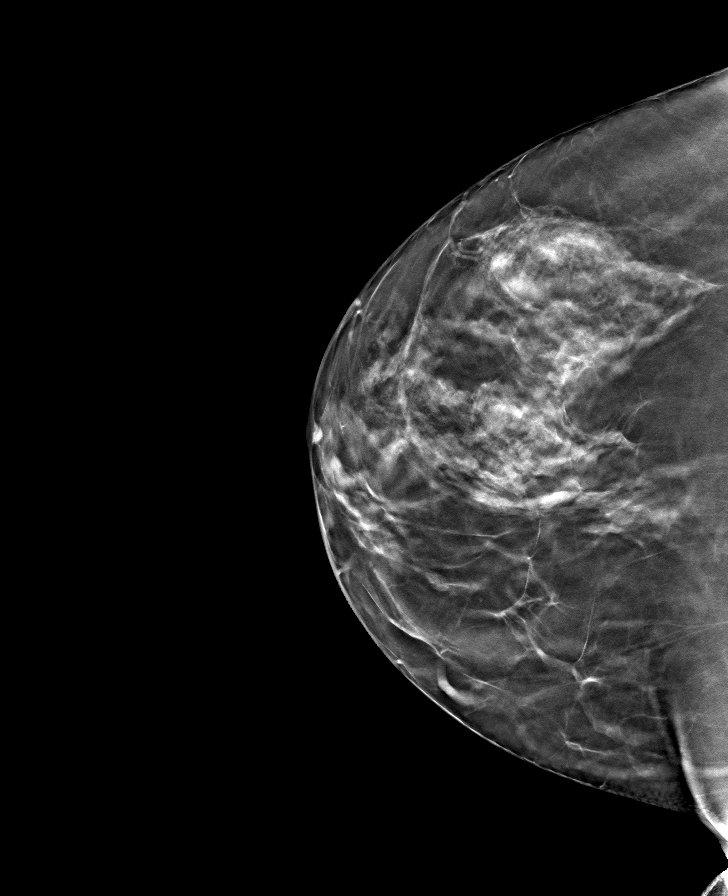

[R MLO tomo · tomo slice 45/89.0]
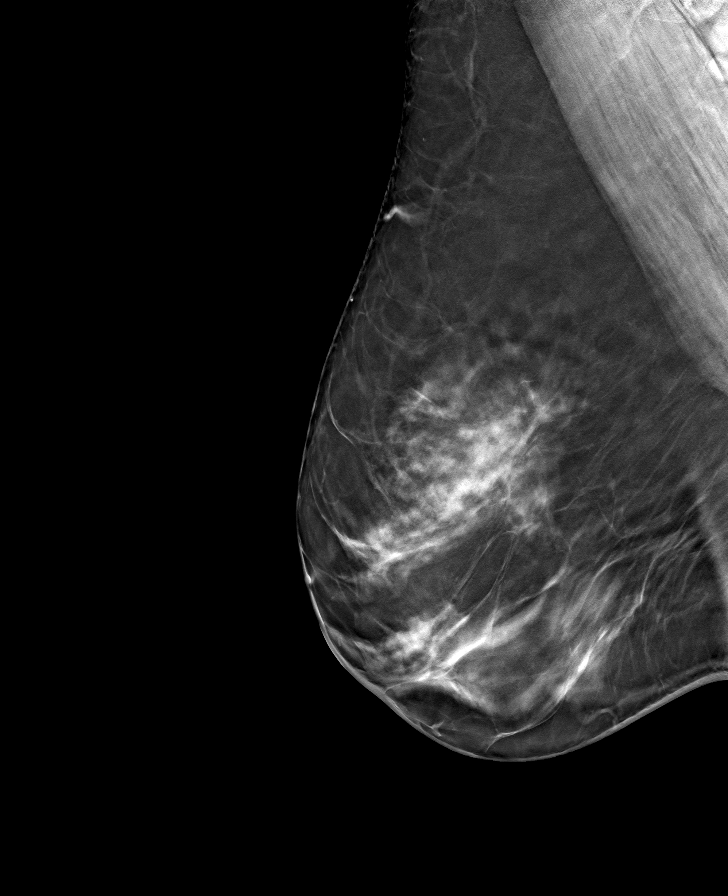

[8 of 24 positions shown; findings below may reference images not displayed]

ACR Breast Density Category c: The breast tissue is heterogeneously
dense, which may obscure small masses.
FINDINGS: There are no findings suspicious for malignancy. Images were
processed with CAD.
IMPRESSION: No mammographic evidence of malignancy. A result letter of this
screening mammogram will be mailed directly to the patient.

RECOMMENDATION:
Screening mammogram in one year. (Code:FT-U-LHB)

BI-RADS CATEGORY  1: Negative.

## 2021-09-19 DIAGNOSIS — F411 Generalized anxiety disorder: Secondary | ICD-10-CM | POA: Diagnosis not present

## 2021-09-19 DIAGNOSIS — G4700 Insomnia, unspecified: Secondary | ICD-10-CM | POA: Diagnosis not present

## 2021-09-19 DIAGNOSIS — F331 Major depressive disorder, recurrent, moderate: Secondary | ICD-10-CM | POA: Diagnosis not present

## 2021-09-19 DIAGNOSIS — N951 Menopausal and female climacteric states: Secondary | ICD-10-CM | POA: Diagnosis not present

## 2021-09-29 DIAGNOSIS — R234 Changes in skin texture: Secondary | ICD-10-CM | POA: Diagnosis not present

## 2021-09-29 DIAGNOSIS — L814 Other melanin hyperpigmentation: Secondary | ICD-10-CM | POA: Diagnosis not present

## 2021-09-29 DIAGNOSIS — H02423 Myogenic ptosis of bilateral eyelids: Secondary | ICD-10-CM | POA: Diagnosis not present

## 2021-11-21 DIAGNOSIS — M25851 Other specified joint disorders, right hip: Secondary | ICD-10-CM | POA: Diagnosis not present

## 2021-11-21 DIAGNOSIS — M24159 Other articular cartilage disorders, unspecified hip: Secondary | ICD-10-CM | POA: Diagnosis not present

## 2021-11-21 DIAGNOSIS — M76891 Other specified enthesopathies of right lower limb, excluding foot: Secondary | ICD-10-CM | POA: Diagnosis not present

## 2021-12-06 DIAGNOSIS — L570 Actinic keratosis: Secondary | ICD-10-CM | POA: Diagnosis not present

## 2021-12-20 DIAGNOSIS — Z01818 Encounter for other preprocedural examination: Secondary | ICD-10-CM | POA: Diagnosis not present

## 2022-01-15 NOTE — Progress Notes (Deleted)
56 y.o. G80P0002 Married Caucasian female here for annual exam.    PCP:  Rachell Cipro, MD   Patient's last menstrual period was 12/18/2014.           Sexually active: {yes no:314532}  The current method of family planning is post menopausal status.    Exercising: {yes no:314532}  {types:19826} Smoker:  no  Health Maintenance: Pap:   11/15/17 Neg:Neg HR HPV, 10-13-12 Neg, 10-10-11 Neg:Neg HR HPV History of abnormal Pap:  no MMG:  06-19-21 Neg/BiRads1 Colonoscopy:  01-21-19 normal;next 10 years BMD:   n/a  Result  n/a TDaP:  2017 Gardasil:   no HIV: Neg in past Hep C: no Screening Labs:  Hb today: ***, Urine today: ***   reports that she has never smoked. She has never used smokeless tobacco. She reports current alcohol use of about 1.0 - 2.0 standard drink of alcohol per week. She reports that she does not use drugs.  Past Medical History:  Diagnosis Date   Arthritis    Cancer ([place]) Basil Cell   History of panic attacks    HSV-1 infection     Past Surgical History:  Procedure Laterality Date   BASAL CELL CARCINOMA EXCISION     CESAREAN SECTION     HERNIA REPAIR     56 years old    Current Outpatient Medications  Medication Sig Dispense Refill   estradiol (VIVELLE-DOT) 0.075 MG/24HR Place 1 patch onto the skin 2 (two) times a week. 24 patch 3   Multiple Vitamin (MULTIVITAMIN WITH MINERALS) TABS tablet Take 1 tablet by mouth daily.     progesterone (PROMETRIUM) 100 MG capsule Take 1 capsule (100 mg total) by mouth daily. 90 capsule 3   sertraline (ZOLOFT) 50 MG tablet Take 50 mg by mouth daily.  0   valACYclovir (VALTREX) 1000 MG tablet      No current facility-administered medications for this visit.    Family History  Problem Relation Age of Onset   Cancer Father        lymphoma   Heart disease Father    Kidney failure Father    Stroke Father    Skin cancer Maternal Uncle     Review of Systems  Exam:   LMP 12/18/2014 Comment: Pregnancy Test Waiver signed  08/01/16    General appearance: alert, cooperative and appears stated age Head: normocephalic, without obvious abnormality, atraumatic Neck: no adenopathy, supple, symmetrical, trachea midline and thyroid normal to inspection and palpation Lungs: clear to auscultation bilaterally Breasts: normal appearance, no masses or tenderness, No nipple retraction or dimpling, No nipple discharge or bleeding, No axillary adenopathy Heart: regular rate and rhythm Abdomen: soft, non-tender; no masses, no organomegaly Extremities: extremities normal, atraumatic, no cyanosis or edema Skin: skin color, texture, turgor normal. No rashes or lesions Lymph nodes: cervical, supraclavicular, and axillary nodes normal. Neurologic: grossly normal  Pelvic: External genitalia:  no lesions              No abnormal inguinal nodes palpated.              Urethra:  normal appearing urethra with no masses, tenderness or lesions              Bartholins and Skenes: normal                 Vagina: normal appearing vagina with normal color and discharge, no lesions              Cervix: no lesions  Pap taken: {yes no:314532} Bimanual Exam:  Uterus:  normal size, contour, position, consistency, mobility, non-tender              Adnexa: no mass, fullness, tenderness              Rectal exam: {yes no:314532}.  Confirms.              Anus:  normal sphincter tone, no lesions  Chaperone was present for exam:  ***  Assessment:   Well woman visit with gynecologic exam.   Plan: Mammogram screening discussed. Self breast awareness reviewed. Pap and HR HPV as above. Guidelines for Calcium, Vitamin D, regular exercise program including cardiovascular and weight bearing exercise.   Follow up annually and prn.   Additional counseling given.  {yes [id number]. _______ minutes face to face time of which over 50% was spent in counseling.    After visit summary provided.

## 2022-01-17 ENCOUNTER — Ambulatory Visit: Payer: BC Managed Care – PPO | Admitting: Obstetrics and Gynecology

## 2022-01-19 ENCOUNTER — Other Ambulatory Visit: Payer: Self-pay

## 2022-01-19 DIAGNOSIS — Z79890 Hormone replacement therapy (postmenopausal): Secondary | ICD-10-CM

## 2022-01-19 NOTE — Telephone Encounter (Signed)
Last AEX 12/23/2019---scheduled for 01/22/2022 Last mammo 06/19/2021-neg birads 1

## 2022-01-22 ENCOUNTER — Ambulatory Visit (INDEPENDENT_AMBULATORY_CARE_PROVIDER_SITE_OTHER): Payer: BC Managed Care – PPO | Admitting: Obstetrics and Gynecology

## 2022-01-22 ENCOUNTER — Encounter: Payer: Self-pay | Admitting: Obstetrics and Gynecology

## 2022-01-22 VITALS — BP 118/80 | HR 78 | Ht 62.0 in | Wt 184.0 lb

## 2022-01-22 DIAGNOSIS — Z79890 Hormone replacement therapy (postmenopausal): Secondary | ICD-10-CM

## 2022-01-22 DIAGNOSIS — Z01419 Encounter for gynecological examination (general) (routine) without abnormal findings: Secondary | ICD-10-CM | POA: Diagnosis not present

## 2022-01-22 DIAGNOSIS — R102 Pelvic and perineal pain: Secondary | ICD-10-CM

## 2022-01-22 MED ORDER — PROGESTERONE MICRONIZED 100 MG PO CAPS: 100.0000 mg | ORAL_CAPSULE | Freq: Every day | ORAL | 3 refills | Status: DC

## 2022-01-22 MED ORDER — VALACYCLOVIR HCL 1 G PO TABS: ORAL_TABLET | ORAL | 2 refills | Status: AC

## 2022-01-22 MED ORDER — ESTRADIOL 0.05 MG/24HR TD PTTW: 1.0000 | MEDICATED_PATCH | TRANSDERMAL | 3 refills | Status: DC

## 2022-01-22 NOTE — Patient Instructions (Signed)
EXERCISE AND DIET:  We recommended that you start or continue a regular exercise program for good health. Regular exercise means any activity that makes your heart beat faster and makes you sweat.  We recommend exercising at least 30 minutes per day at least 3 days a week, preferably 4 or 5.  We also recommend a diet low in fat and sugar.  Inactivity, poor dietary choices and obesity can cause diabetes, heart attack, stroke, and kidney damage, among others.    ALCOHOL AND SMOKING:  Women should limit their alcohol intake to no more than 7 drinks/beers/glasses of wine (combined, not each!) per week. Moderation of alcohol intake to this level decreases your risk of breast cancer and liver damage. And of course, no recreational drugs are part of a healthy lifestyle.  And absolutely no smoking or even second hand smoke. Most people know smoking can cause heart and lung diseases, but did you know it also contributes to weakening of your bones? Aging of your skin?  Yellowing of your teeth and nails?  CALCIUM AND VITAMIN D:  Adequate intake of calcium and Vitamin D are recommended.  The recommendations for exact amounts of these supplements seem to change often, but generally speaking 600 mg of calcium (either carbonate or citrate) and 800 units of Vitamin D per day seems prudent. Certain women may benefit from higher intake of Vitamin D.  If you are among these women, your doctor will have told you during your visit.    PAP SMEARS:  Pap smears, to check for cervical cancer or precancers,  have traditionally been done yearly, although recent scientific advances have shown that most women can have pap smears less often.  However, every woman still should have a physical exam from her gynecologist every year. It will include a breast check, inspection of the vulva and vagina to check for abnormal growths or skin changes, a visual exam of the cervix, and then an exam to evaluate the size and shape of the uterus and  ovaries.  And after 55 years of age, a rectal exam is indicated to check for rectal cancers. We will also provide age appropriate advice regarding health maintenance, like when you should have certain vaccines, screening for sexually transmitted diseases, bone density testing, colonoscopy, mammograms, etc.   MAMMOGRAMS:  All women over 40 years old should have a yearly mammogram. Many facilities now offer a "3D" mammogram, which may cost around $50 extra out of pocket. If possible,  we recommend you accept the option to have the 3D mammogram performed.  It both reduces the number of women who will be called back for extra views which then turn out to be normal, and it is better than the routine mammogram at detecting truly abnormal areas.    COLONOSCOPY:  Colonoscopy to screen for colon cancer is recommended for all women at age 50.  We know, you hate the idea of the prep.  We agree, BUT, having colon cancer and not knowing it is worse!!  Colon cancer so often starts as a polyp that can be seen and removed at colonscopy, which can quite literally save your life!  And if your first colonoscopy is normal and you have no family history of colon cancer, most women don't have to have it again for 10 years.  Once every ten years, you can do something that may end up saving your life, right?  We will be happy to help you get it scheduled when you are ready.    Be sure to check your insurance coverage so you understand how much it will cost.  It may be covered as a preventative service at no cost, but you should check your particular policy.    Calcium Content in Foods Calcium is the most abundant mineral in the body. Most of the body's calcium supply is stored in bones and teeth. Calcium helps many parts of the body function normally, including: Blood and blood vessels. Nerves. Hormones. Muscles. Bones and teeth. When your calcium stores are low, you may be at risk for low bone mass, bone loss, and broken bones  (fractures). When you get enough calcium, it helps to support strong bones and teeth throughout your life. Calcium is especially important for: Children during growth spurts. Girls during adolescence. Women who are pregnant or breastfeeding. Women after their menstrual cycle stops (postmenopause). Women whose menstrual cycle has stopped due to anorexia nervosa or regular intense exercise. People who cannot eat or digest dairy products. Vegans. Recommended daily amounts of calcium: Women (ages 19 to 50): 1,000 mg per day. Women (ages 51 and older): 1,200 mg per day. Men (ages 19 to 70): 1,000 mg per day. Men (ages 71 and older): 1,200 mg per day. Women (ages 9 to 18): 1,300 mg per day. Men (ages 9 to 18): 1,300 mg per day. General information Eat foods that are high in calcium. Try to get most of your calcium from food. Some people may benefit from taking calcium supplements. Check with your health care provider or diet and nutrition specialist (dietitian) before starting any calcium supplements. Calcium supplements may interact with certain medicines. Too much calcium may cause other health problems, such as constipation and kidney stones. For the body to absorb calcium, it needs vitamin D. Sources of vitamin D include: Skin exposure to direct sunlight. Foods, such as egg yolks, liver, mushrooms, saltwater fish, and fortified milk. Vitamin D supplements. Check with your health care provider or dietitian before starting any vitamin D supplements. What foods are high in calcium?  Foods that are high in calcium contain more than 100 milligrams per serving. Fruits Fortified orange juice or other fruit juice, 300 mg per 8 oz serving. Vegetables Collard greens, 360 mg per 8 oz serving. Kale, 100 mg per 8 oz serving. Bok choy, 160 mg per 8 oz serving. Grains Fortified ready-to-eat cereals, 100 to 1,000 mg per 8 oz serving. Fortified frozen waffles, 200 mg in 2 waffles. Oatmeal, 140 mg in  1 cup. Meats and other proteins Sardines, canned with bones, 325 mg per 3 oz serving. Salmon, canned with bones, 180 mg per 3 oz serving. Canned shrimp, 125 mg per 3 oz serving. Baked beans, 160 mg per 4 oz serving. Tofu, firm, made with calcium sulfate, 253 mg per 4 oz serving. Dairy Yogurt, plain, low-fat, 310 mg per 6 oz serving. Nonfat milk, 300 mg per 8 oz serving. American cheese, 195 mg per 1 oz serving. Cheddar cheese, 205 mg per 1 oz serving. Cottage cheese 2%, 105 mg per 4 oz serving. Fortified soy, rice, or almond milk, 300 mg per 8 oz serving. Mozzarella, part skim, 210 mg per 1 oz serving. The items listed above may not be a complete list of foods high in calcium. Actual amounts of calcium may be different depending on processing. Contact a dietitian for more information. What foods are lower in calcium? Foods that are lower in calcium contain 50 mg or less per serving. Fruits Apple, about 6 mg. Banana, about 12 mg.   Vegetables Lettuce, 19 mg per 2 oz serving. Tomato, about 11 mg. Grains Rice, 4 mg per 6 oz serving. Boiled potatoes, 14 mg per 8 oz serving. White bread, 6 mg per slice. Meats and other proteins Egg, 27 mg per 2 oz serving. Red meat, 7 mg per 4 oz serving. Chicken, 17 mg per 4 oz serving. Fish, cod, or trout, 20 mg per 4 oz serving. Dairy Cream cheese, regular, 14 mg per 1 Tbsp serving. Brie cheese, 50 mg per 1 oz serving. Parmesan cheese, 70 mg per 1 Tbsp serving. The items listed above may not be a complete list of foods lower in calcium. Actual amounts of calcium may be different depending on processing. Contact a dietitian for more information. Summary Calcium is an important mineral in the body because it affects many functions. Getting enough calcium helps support strong bones and teeth throughout your life. Try to get most of your calcium from food. Calcium supplements may interact with certain medicines. Check with your health care provider  or dietitian before starting any calcium supplements. This information is not intended to replace advice given to you by your health care provider. Make sure you discuss any questions you have with your health care provider. Document Revised: 08/05/2019 Document Reviewed: 08/05/2019 Elsevier Patient Education  2023 Elsevier Inc.  

## 2022-01-22 NOTE — Progress Notes (Signed)
56 y.o. G13P0002 Married Caucasian female here for annual exam.    Patient is on HRT.  Waking up hot for a couple of months. She just stays hot.  No missed doses.   She thinks her PCP lowered her estradiol patch to 0.05 mg.   Feeling pelvic pressure.  Not constant.  Not significant.  Not increasing.  No bleeding.   No vaginal discharge.   No dysuria.  No increase in urgency.    Drinking more carbonated beverages.    Regular bowel function.   Wants refill of Valtrex.   Doing well on Zoloft.   PCP:   Rachell Cipro, MD  Patient's last menstrual period was 12/18/2014.           Sexually active: Yes.    The current method of family planning is post menopausal status.    Exercising: Yes.    Home exercise routine includes walking 1 hrs per day. Smoker:  no  Health Maintenance: Pap:  11/15/17 Neg:Neg HR HPV, 10-13-12 Neg, 10-10-11 Neg:Neg HR HPV History of abnormal Pap:  no MMG:  06/19/2021 BI-RADS CATEGORY  1: Negative. Colonoscopy:  01/21/2019 - normal.  Due in 10 years. BMD:   n/a Result  n/a TDaP: 2017 Gardasil:   no HIV:  neg in past Hep C:  insurance physical  Screening Labs:  PCP   reports that she has never smoked. She has never used smokeless tobacco. She reports current alcohol use of about 1.0 - 2.0 standard drink of alcohol per week. She reports that she does not use drugs.  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 24 patch 3   Multiple Vitamin (MULTIVITAMIN WITH MINERALS) TABS tablet Take 1 tablet by mouth daily.     progesterone (PROMETRIUM) 100 MG  capsule Take 1 capsule (100 mg total) by mouth daily. 90 capsule 3   sertraline (ZOLOFT) 50 MG tablet Take 50 mg by mouth daily.  0   valACYclovir (VALTREX) 1000 MG tablet      No current facility-administered medications for this visit.    Family History  Problem Relation Age of Onset   Cancer Father        lymphoma   Heart disease Father    Kidney failure Father    Stroke Father    Skin cancer Maternal Uncle     Review of Systems  Endocrine:       Increased heat sensation at night    Exam:   BP 118/80 (BP Location: Right Arm, Patient Position: Sitting, Cuff Size: Normal)   Pulse 78   Ht '5\' 2"'$  (1.575 m)   Wt 184 lb (83.5 kg)   LMP 12/18/2014 Comment: Pregnancy Test Waiver signed 08/01/16  SpO2 98%   BMI 33.65 kg/m     General appearance: alert, cooperative and appears stated age Head: normocephalic, without obvious abnormality, atraumatic Neck: no adenopathy, supple, symmetrical, trachea midline and thyroid normal to inspection and palpation Lungs: clear to auscultation bilaterally Breasts: normal appearance,  no masses or tenderness, No nipple retraction or dimpling, No nipple discharge or bleeding, No axillary adenopathy Heart: regular rate and rhythm Abdomen: soft, non-tender; no masses, no organomegaly Extremities: extremities normal, atraumatic, no cyanosis or edema Skin: skin color, texture, turgor normal. No rashes or lesions Lymph nodes: cervical, supraclavicular, and axillary nodes normal. Neurologic: grossly normal  Pelvic: External genitalia:  no lesions              No abnormal inguinal nodes palpated.              Urethra:  normal appearing urethra with no masses, tenderness or lesions              Bartholins and Skenes: normal                 Vagina: normal appearing vagina with normal color and discharge, no lesions              Cervix: no lesions              Pap taken: no Bimanual Exam:  Uterus:  normal size, contour, position, consistency, mobility,  non-tender              Adnexa: no mass, fullness, tenderness              Rectal exam: yes.  Confirms.              Anus:  normal sphincter tone, no lesions  Chaperone was present for exam:  Kimalexis, CMA  Assessment:   Well woman visit with gynecologic exam. HRT. Pelvic pressure.  Hx HSV 1.  Uses Valtrex.   Hx panic attacks.   On Zoloft.  PCP prescribing.   Plan: Mammogram screening discussed. Self breast awareness reviewed. Pap and HR HPV 2024. Guidelines for Calcium, Vitamin D, regular exercise program including cardiovascular and weight bearing exercise. Discused WHI and use of HRT which can increase risk of PE, DVT, MI, stroke and breast cancer.  Will continue Vivelle Dot at 0.05 mg twice weekly and Prometrium 100 mg po q hs for one year.   Rx for Valtrex 2000 mg po bid x 24 hours prn.   I recommended heating pad and Ibuprofen 800 mg every 8 hours as needed for discomfort.  Return for pelvic US.  Follow up annually and prn.   After visit summary provided.

## 2022-01-30 ENCOUNTER — Other Ambulatory Visit: Payer: Self-pay | Admitting: Obstetrics and Gynecology

## 2022-01-30 NOTE — Telephone Encounter (Signed)
Last AEX 01/22/2022  Pharmacy requesting 90-day supply.  Rx sent on 01/22/2022 was for #30 w/ 2 refills.

## 2022-02-16 DIAGNOSIS — M76891 Other specified enthesopathies of right lower limb, excluding foot: Secondary | ICD-10-CM | POA: Diagnosis not present

## 2022-02-16 DIAGNOSIS — M25851 Other specified joint disorders, right hip: Secondary | ICD-10-CM | POA: Diagnosis not present

## 2022-02-16 DIAGNOSIS — M24159 Other articular cartilage disorders, unspecified hip: Secondary | ICD-10-CM | POA: Diagnosis not present

## 2022-02-16 NOTE — Telephone Encounter (Signed)
Pt seen on 01/22/2022. Will close encounter.

## 2022-02-20 ENCOUNTER — Ambulatory Visit (INDEPENDENT_AMBULATORY_CARE_PROVIDER_SITE_OTHER): Payer: BC Managed Care – PPO | Admitting: Obstetrics and Gynecology

## 2022-02-20 ENCOUNTER — Encounter: Payer: Self-pay | Admitting: Obstetrics and Gynecology

## 2022-02-20 ENCOUNTER — Ambulatory Visit (INDEPENDENT_AMBULATORY_CARE_PROVIDER_SITE_OTHER): Payer: BC Managed Care – PPO

## 2022-02-20 VITALS — BP 134/80 | HR 78 | Ht 62.0 in | Wt 180.0 lb

## 2022-02-20 DIAGNOSIS — R102 Pelvic and perineal pain: Secondary | ICD-10-CM | POA: Diagnosis not present

## 2022-02-20 NOTE — Progress Notes (Unsigned)
GYNECOLOGY  VISIT   HPI: 56 y.o.   Married  Caucasian  female   G2P0002 with Patient's last menstrual period was 12/18/2014.   here for pelvic ultrasound due to pelvic pressure.   States her symptoms have improved overall.   GYNECOLOGIC HISTORY: Patient's last menstrual period was 12/18/2014. Contraception:  NA Menopausal hormone therapy:  estradiol, Prometrium Last mammogram:  06/19/2021 BI-RADS CATEGORY  1: Negative. 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 24 patch 3   Multiple Vitamin (MULTIVITAMIN WITH MINERALS) TABS tablet Take 1 tablet by mouth daily.     progesterone (PROMETRIUM) 100 MG capsule Take 1 capsule (100 mg total) by mouth daily. 90 capsule 3   sertraline (ZOLOFT) 50 MG tablet Take 50 mg by mouth daily.  0   valACYclovir (VALTREX) 1000 MG tablet Take 2 tablets (2000 mg) by mouth twice a day for 24 hours as needed for an outbreak. 30 tablet 2   No current facility-administered medications for this visit.     ALLERGIES: Penicillin g and Penicillins  Family History  Problem Relation Age of Onset   Cancer Father        lymphoma   Heart disease Father    Kidney failure Father    Stroke Father     Skin cancer Maternal Uncle     Social History   Socioeconomic History   Marital status: Married    Spouse name: Not on file   Number of children: Not on file   Years of education: Not on file   Highest education level: Not on file  Occupational History   Not on file  Tobacco Use   Smoking status: Never   Smokeless tobacco: Never  Vaping Use   Vaping Use: Never used  Substance and Sexual Activity   Alcohol use: Yes    Alcohol/week: 1.0 - 2.0 standard drink of alcohol    Types: 1 - 2 Glasses of wine per week   Drug use: No   Sexual activity: Yes    Birth control/protection: Post-menopausal  Other Topics Concern   Not on file  Social History Narrative   Not on file   Social Determinants of Health   Financial Resource Strain: Not on file  Food Insecurity: Not on file  Transportation Needs: Not on file  Physical Activity: Not on file  Stress: Not on file  Social Connections: Not on file  Intimate Partner Violence: Not on file    Review of Systems  See HPI.  PHYSICAL EXAMINATION:    BP 134/80 (BP Location: Right Arm, Patient Position: Sitting, Cuff Size: Normal)   Pulse 78   Ht '5\' 2"'$  (1.575 m)   Wt 180 lb (81.6 kg)   LMP 12/18/2014 Comment: Pregnancy Test Waiver signed 08/01/16  BMI 32.92 kg/m     General appearance: alert, cooperative and appears stated age   Pelvic US  Uterus 7.66 x 4.93 x 3.67 cm.  No myometrial masses. EMS 3.96 cm.  No masses. Left ovary 2.82 x 1.92 x 1.75 cm.  Right ovary 2.33 x 1.19 cm x 0.91 cm.  No adnexal masses.  No free fluid.   ASSESSMENT  Pelvic pressure. Postmenopausal female.  HRT patient.    PLAN  Pelvic US images and report reviewed.  Reassurance given regarding findings today.  Etiologies of pressure reviewed:  pelvic organ prolapse, constipation.  Return is symptoms increase.  OK to continue HRT. FU for annual exam and prn.    An After Visit Summary was printed and given to the patient.

## 2022-05-30 ENCOUNTER — Other Ambulatory Visit: Payer: Self-pay | Admitting: Sports Medicine

## 2022-05-30 DIAGNOSIS — M25851 Other specified joint disorders, right hip: Secondary | ICD-10-CM

## 2022-06-11 ENCOUNTER — Ambulatory Visit
Admission: RE | Admit: 2022-06-11 | Discharge: 2022-06-11 | Disposition: A | Discharge status: Home / Self Care | Payer: Self-pay | Source: Ambulatory Visit | Attending: Sports Medicine | Admitting: Sports Medicine

## 2022-06-11 DIAGNOSIS — M25851 Other specified joint disorders, right hip: Secondary | ICD-10-CM

## 2022-08-14 ENCOUNTER — Other Ambulatory Visit: Payer: Self-pay | Admitting: Obstetrics and Gynecology

## 2022-08-14 DIAGNOSIS — Z1231 Encounter for screening mammogram for malignant neoplasm of breast: Secondary | ICD-10-CM

## 2022-08-21 ENCOUNTER — Ambulatory Visit
Admission: RE | Admit: 2022-08-21 | Discharge: 2022-08-21 | Disposition: A | Discharge status: Home / Self Care | Payer: Commercial Managed Care - PPO | Source: Ambulatory Visit | Attending: Obstetrics and Gynecology | Admitting: Obstetrics and Gynecology

## 2022-08-21 DIAGNOSIS — Z1231 Encounter for screening mammogram for malignant neoplasm of breast: Secondary | ICD-10-CM

## 2022-11-20 ENCOUNTER — Other Ambulatory Visit: Payer: Self-pay

## 2022-11-20 MED ORDER — ESTRADIOL 0.05 MG/24HR TD PTTW: 1.0000 | MEDICATED_PATCH | TRANSDERMAL | 1 refills | Status: DC

## 2022-11-20 NOTE — Telephone Encounter (Signed)
Med refill request: estradiol Last AEX: 01/22/22 Next AEX: 02/25/23 Last MMG (if hormonal med) 08/21/22 Refill authorized: Please Advise, #24, 3 RF

## 2023-01-19 ENCOUNTER — Other Ambulatory Visit: Payer: Self-pay | Admitting: Obstetrics and Gynecology

## 2023-01-19 DIAGNOSIS — Z79890 Hormone replacement therapy (postmenopausal): Secondary | ICD-10-CM

## 2023-01-21 NOTE — Telephone Encounter (Signed)
Med refill request:Progesterone Last AEX: 01/22/22 Next AEX: 02/25/23 Last MMG (if hormonal med) 08/21/22 Refill authorized: Please Advise, #90, 0 RF

## 2023-02-09 ENCOUNTER — Other Ambulatory Visit: Payer: Self-pay | Admitting: Obstetrics and Gynecology

## 2023-02-11 NOTE — Progress Notes (Deleted)
57 y.o. G53P0002 Married Caucasian female here for annual exam.    PCP: Lewis Moccasin, MD   Patient's last menstrual period was 12/18/2014.           Sexually active: {yes no:314532}  The current method of family planning is post menopausal status.    Exercising: {yes no:314532}  {types:19826} Smoker:  no  OB History  Gravida Para Term Preterm AB Living  2 2 0 0 0 2  SAB IAB Ectopic Multiple Live Births  0 0 0 0 0    # Outcome Date GA Lbr Len/2nd Weight Sex Type Anes PTL Lv  2 Para     F CS-Unspec     1 Para     M CS-Unspec        Health Maintenance: Pap:   11/15/17 Neg:Neg HR HPV, 10-13-12 Neg, 10-10-11 Neg:Neg HR HPV  History of abnormal Pap:  no MMG: 08/21/22 Breast Density Cat C, BI-RADS CATEGORY  1: Negative.  Colonoscopy:   [facility] Colonoscopy          Colonoscopy (Every 10 Years) Next due on 01/20/2029    01/21/2019  [facility] COLONOSCOPY   Only the first 1 history entries have been loaded, but more history exists.           BMD:  n/a  Result  n/a  HIV: neg in past Hep C: neg in past  Immunization History  Administered Date(s) Administered   PFIZER(Purple Top)SARS-COV-2 Vaccination 07/25/2019, 08/19/2019     {Labs (Optional):23779}   reports that she has never smoked. She has never used smokeless tobacco. She reports current alcohol use of about 1.0 - 2.0 standard drink of alcohol per week. She reports that she does not use drugs.  Past Medical History:  Diagnosis Date   Arthritis    Cancer ([facility]) Basil Cell   History of panic attacks    HSV-1 infection     Past Surgical History:  Procedure Laterality Date   BASAL CELL CARCINOMA EXCISION     CESAREAN SECTION     HERNIA REPAIR     57 years old   LIPOSUCTION      Current Outpatient Medications  Medication Sig Dispense Refill   estradiol (VIVELLE-DOT) 0.05 MG/24HR patch Place 1 patch (0.05 mg total) onto the skin 2 (two) times a week. 24 patch 1   Multiple Vitamin (MULTIVITAMIN WITH MINERALS) TABS  tablet Take 1 tablet by mouth daily.     progesterone (PROMETRIUM) 100 MG capsule TAKE 1 CAPSULE BY MOUTH EVERY DAY 90 capsule 0   sertraline (ZOLOFT) 50 MG tablet Take 50 mg by mouth daily.  0   valACYclovir (VALTREX) 1000 MG tablet Take 2 tablets (2000 mg) by mouth twice a day for 24 hours as needed for an outbreak. 30 tablet 2   No current facility-administered medications for this visit.    Family History  Problem Relation Age of Onset   Cancer Father        lymphoma   Heart disease Father    Kidney failure Father    Stroke Father    Skin cancer Maternal Uncle     Review of Systems  Exam:   LMP 12/18/2014 Comment: Pregnancy Test Waiver signed 08/01/16    General appearance: alert, cooperative and appears stated age Head: normocephalic, without obvious abnormality, atraumatic Neck: no adenopathy, supple, symmetrical, trachea midline and thyroid normal to inspection and palpation Lungs: clear to auscultation bilaterally Breasts: normal appearance, no masses or tenderness, No nipple  retraction or dimpling, No nipple discharge or bleeding, No axillary adenopathy Heart: regular rate and rhythm Abdomen: soft, non-tender; no masses, no organomegaly Extremities: extremities normal, atraumatic, no cyanosis or edema Skin: skin color, texture, turgor normal. No rashes or lesions Lymph nodes: cervical, supraclavicular, and axillary nodes normal. Neurologic: grossly normal  Pelvic: External genitalia:  no lesions              No abnormal inguinal nodes palpated.              Urethra:  normal appearing urethra with no masses, tenderness or lesions              Bartholins and Skenes: normal                 Vagina: normal appearing vagina with normal color and discharge, no lesions              Cervix: no lesions              Pap taken: {yes no:314532} Bimanual Exam:  Uterus:  normal size, contour, position, consistency, mobility, non-tender              Adnexa: no mass, fullness,  tenderness              Rectal exam: {yes no:314532}.  Confirms.              Anus:  normal sphincter tone, no lesions  Chaperone was present for exam:  {BSCHAPERONE:31226::"Nirvana Blanchett F, CMA"}   Assessment and Plan:   ***  Mammogram screening discussed. Self breast awareness reviewed. Guidelines for Calcium, Vitamin D, regular exercise program including cardiovascular and weight bearing exercise.   No follow-ups on file.   Additional counseling given.  {yes [id number]. _______ minutes face to face time of which over 50% was spent in counseling.    After visit summary provided.

## 2023-02-11 NOTE — Telephone Encounter (Signed)
Medication refill request: Vivelle dot  Last AEX:  01/22/22  Next AEX: 02/25/23  Last MMG (if hormonal medication request): 08/21/22 Bi-rads 1 neg  Refill authorized: #8 with 0 rf to get her to her aex

## 2023-02-25 ENCOUNTER — Ambulatory Visit: Payer: Self-pay | Admitting: Obstetrics and Gynecology

## 2023-06-02 ENCOUNTER — Other Ambulatory Visit: Payer: Self-pay | Admitting: Obstetrics and Gynecology

## 2023-06-02 DIAGNOSIS — Z79890 Hormone replacement therapy (postmenopausal): Secondary | ICD-10-CM

## 2023-06-03 NOTE — Telephone Encounter (Signed)
 Medication refill request: progesterone   Last AEX:  03/24/22 Next AEX: not scheduled  Last MMG (if hormonal medication request): 08/21/22 Bi-rads 1 neg  Refill authorized: please advise
